# Patient Record
Sex: Female | Born: 1975 | Race: White | Hispanic: No | Smoking: Current every day smoker
Health system: Southern US, Community
[De-identification: ages and names within clinical notes are randomized; demographics above are authoritative.]

## PROBLEM LIST (undated history)

## (undated) DIAGNOSIS — F329 Major depressive disorder, single episode, unspecified: Secondary | ICD-10-CM

## (undated) DIAGNOSIS — F32A Depression, unspecified: Secondary | ICD-10-CM

## (undated) DIAGNOSIS — R569 Unspecified convulsions: Secondary | ICD-10-CM

## (undated) DIAGNOSIS — G40909 Epilepsy, unspecified, not intractable, without status epilepticus: Secondary | ICD-10-CM

## (undated) DIAGNOSIS — F419 Anxiety disorder, unspecified: Secondary | ICD-10-CM

## (undated) DIAGNOSIS — R011 Cardiac murmur, unspecified: Secondary | ICD-10-CM

## (undated) HISTORY — PX: FRACTURE SURGERY: SHX138

## (undated) HISTORY — DX: Unspecified convulsions: R56.9

---

## 1999-04-03 ENCOUNTER — Inpatient Hospital Stay (HOSPITAL_COMMUNITY): Admission: AD | Admit: 1999-04-03 | Discharge: 1999-04-03 | Payer: Self-pay | Admitting: Obstetrics

## 2001-05-12 ENCOUNTER — Inpatient Hospital Stay (HOSPITAL_COMMUNITY): Admission: AD | Admit: 2001-05-12 | Discharge: 2001-05-12 | Payer: Self-pay | Admitting: Obstetrics and Gynecology

## 2001-05-12 ENCOUNTER — Encounter: Payer: Self-pay | Admitting: Obstetrics and Gynecology

## 2001-07-14 ENCOUNTER — Encounter (INDEPENDENT_AMBULATORY_CARE_PROVIDER_SITE_OTHER): Payer: Self-pay | Admitting: Specialist

## 2001-07-14 ENCOUNTER — Inpatient Hospital Stay (HOSPITAL_COMMUNITY): Admission: RE | Admit: 2001-07-14 | Discharge: 2001-07-16 | Payer: Self-pay | Admitting: Obstetrics and Gynecology

## 2002-02-27 ENCOUNTER — Inpatient Hospital Stay (HOSPITAL_COMMUNITY): Admission: AD | Admit: 2002-02-27 | Discharge: 2002-02-27 | Payer: Self-pay | Admitting: Obstetrics and Gynecology

## 2002-03-12 ENCOUNTER — Other Ambulatory Visit: Admission: RE | Admit: 2002-03-12 | Discharge: 2002-03-12 | Payer: Self-pay | Admitting: Obstetrics and Gynecology

## 2002-04-14 ENCOUNTER — Emergency Department (HOSPITAL_COMMUNITY): Admission: EM | Admit: 2002-04-14 | Discharge: 2002-04-15 | Payer: Self-pay | Admitting: Emergency Medicine

## 2002-04-15 ENCOUNTER — Encounter: Payer: Self-pay | Admitting: Emergency Medicine

## 2004-04-29 ENCOUNTER — Emergency Department (HOSPITAL_COMMUNITY): Admission: EM | Admit: 2004-04-29 | Discharge: 2004-04-29 | Payer: Self-pay | Admitting: Emergency Medicine

## 2006-12-30 ENCOUNTER — Emergency Department (HOSPITAL_COMMUNITY): Admission: EM | Admit: 2006-12-30 | Discharge: 2006-12-30 | Payer: Self-pay | Admitting: Emergency Medicine

## 2008-03-05 IMAGING — CR DG CHEST 2V
2 series · 2 of 2 positions shown · non-contrast
Comparison: none

CLINICAL DATA: Chest pain. 
 CHEST ? 2 VIEW:

[w chest pa]
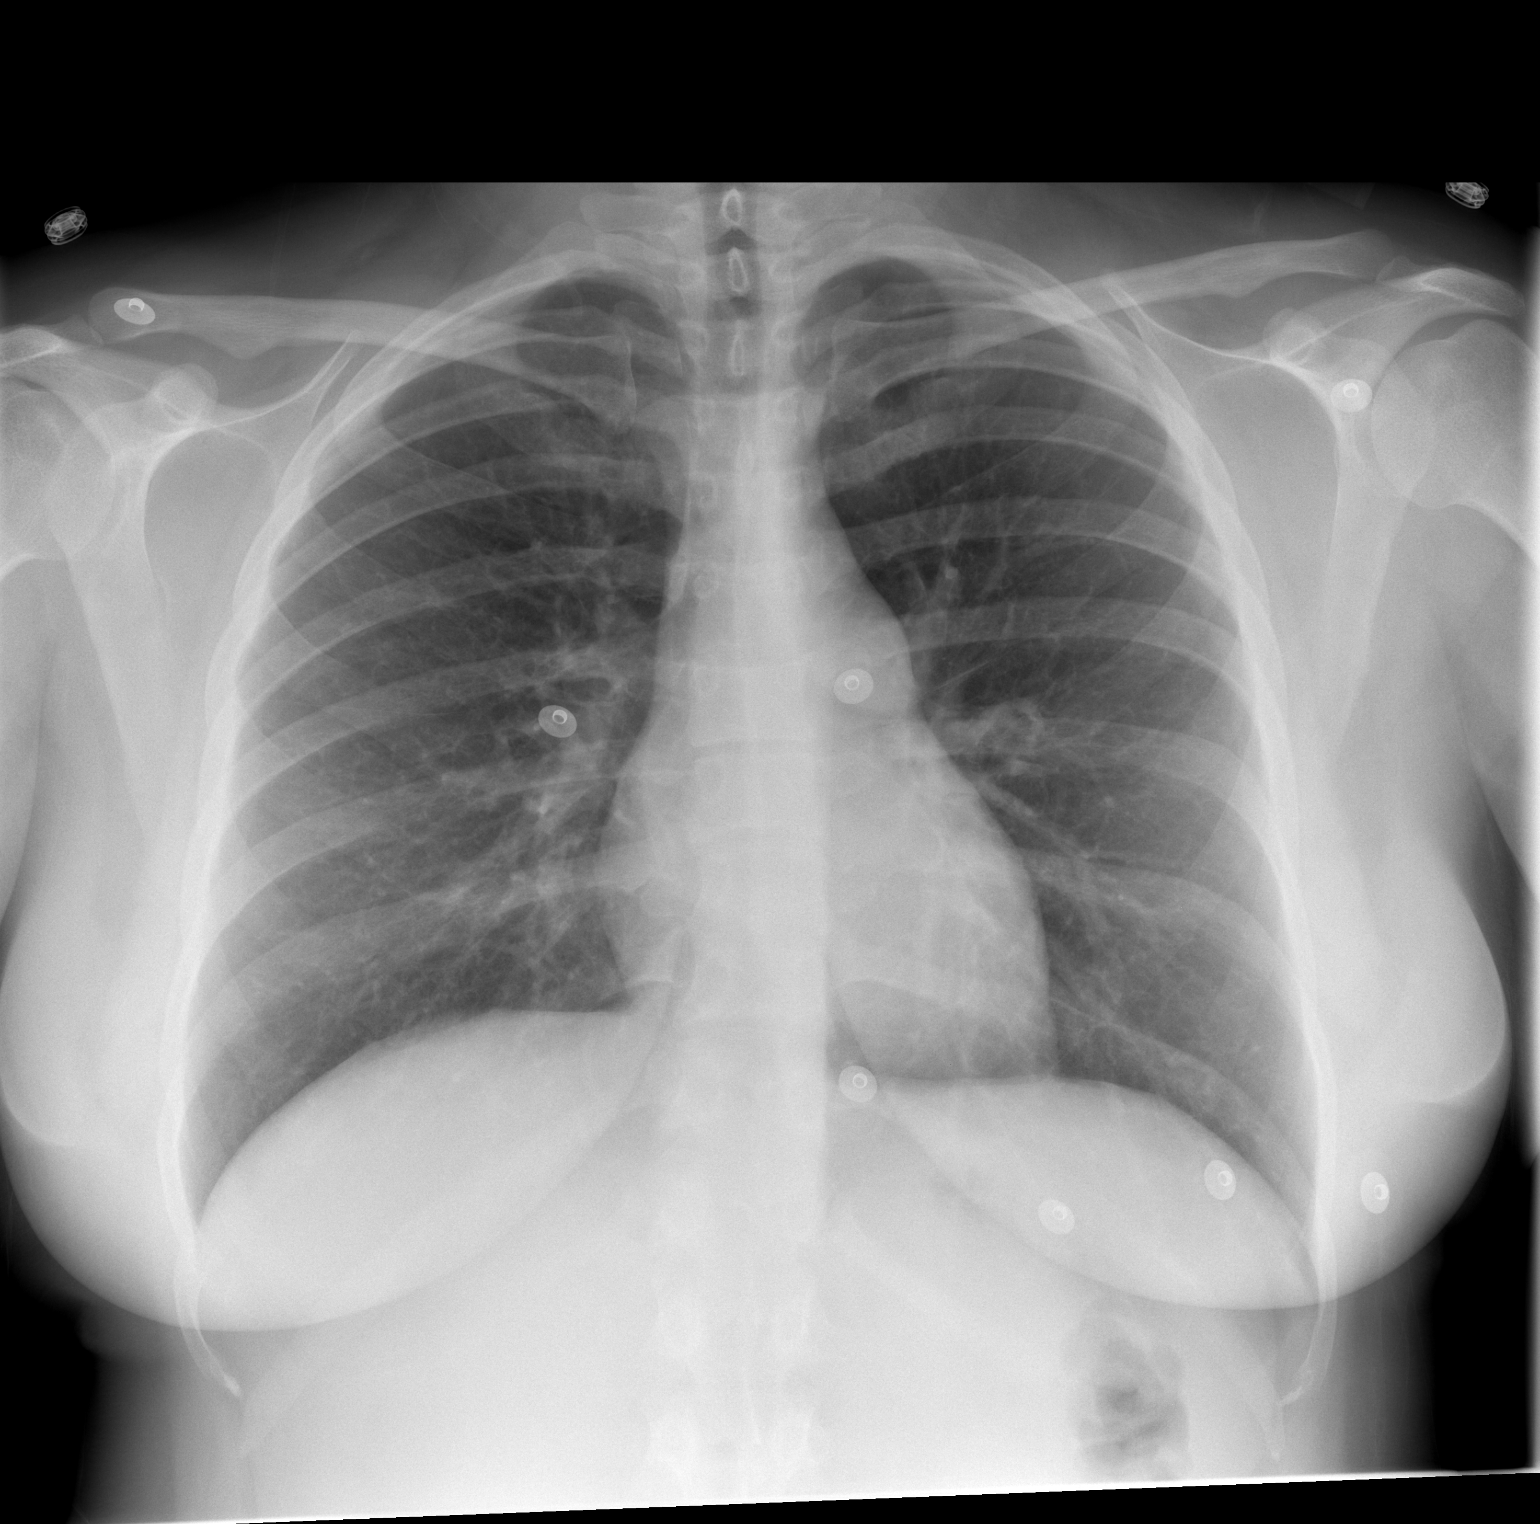

[w chest lat]
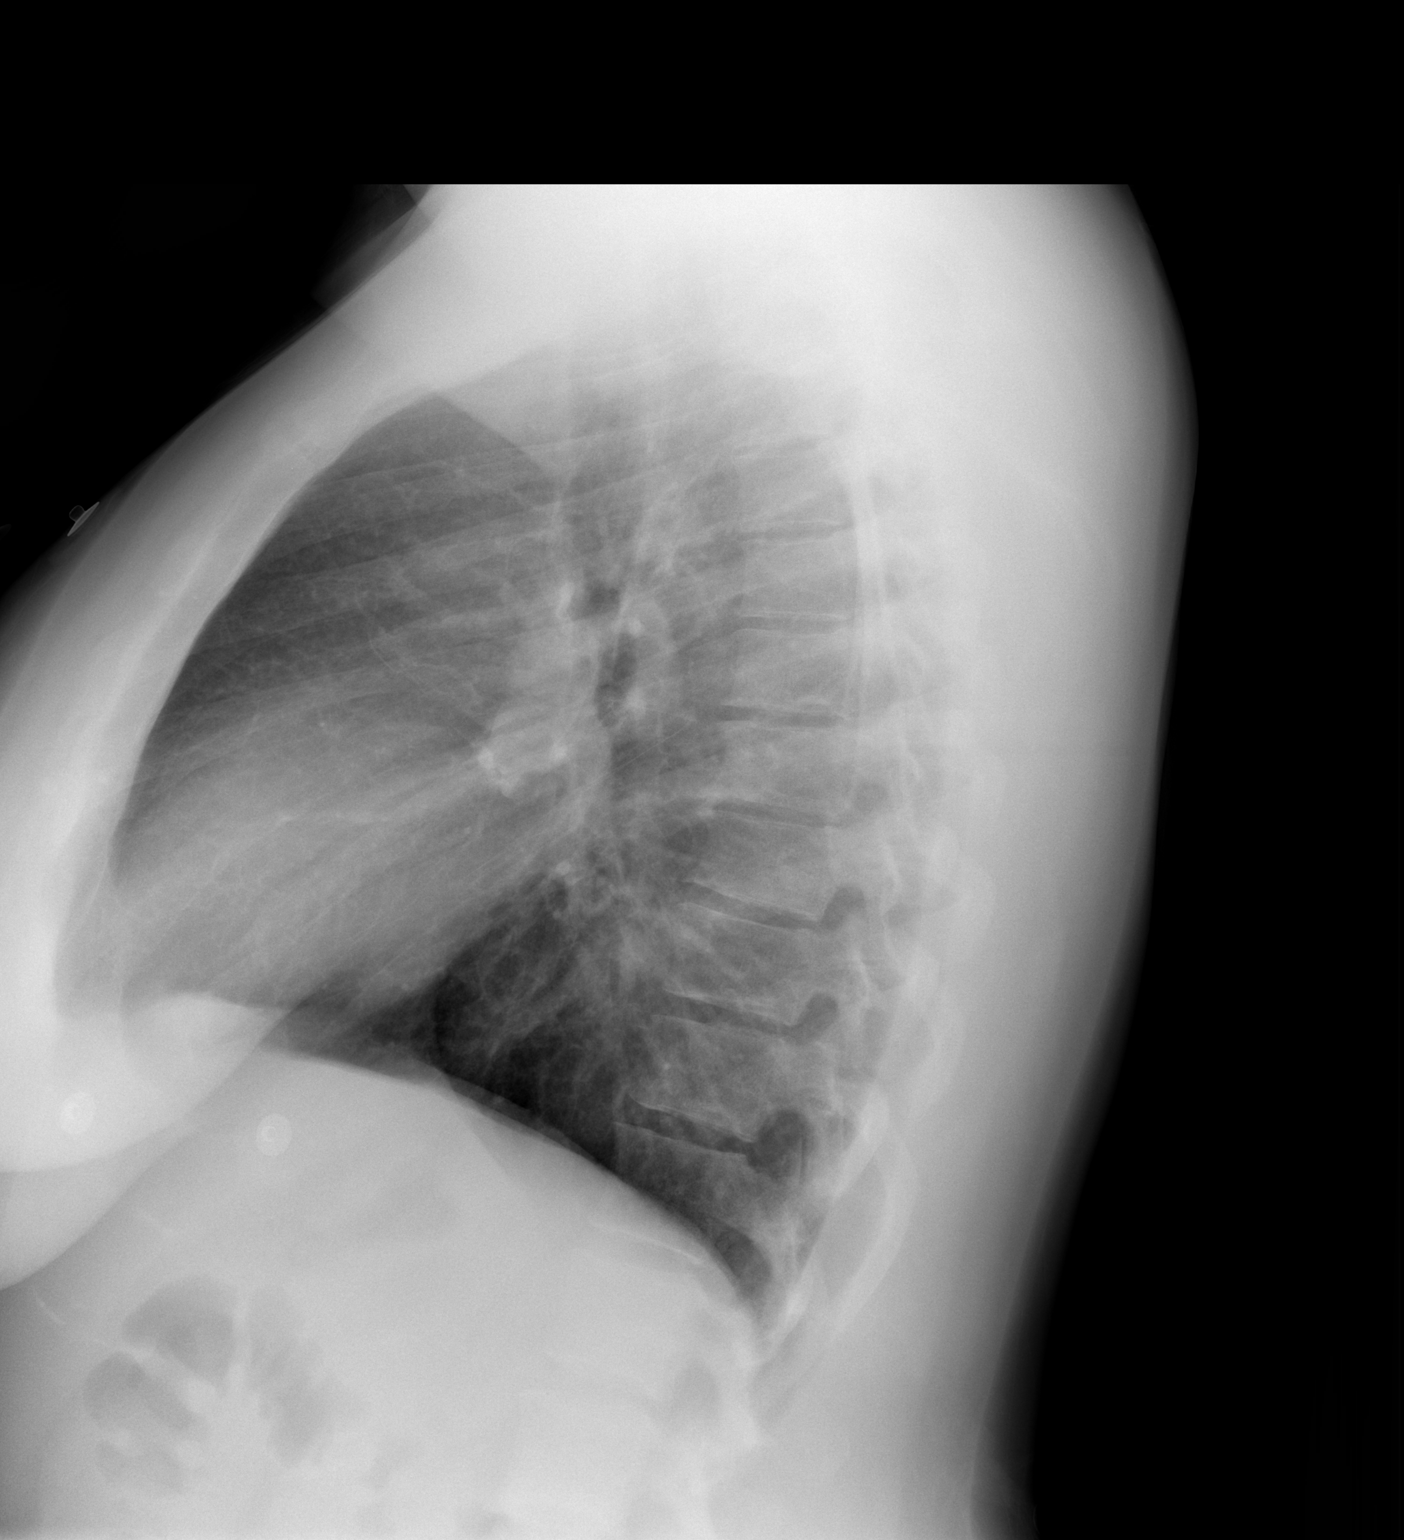

[2 of 2 positions shown; findings below may reference images not displayed]

FINDINGS: The lungs are clear.  Heart size is normal.  No effusion or focal bony abnormality.
IMPRESSION: No acute disease.

## 2008-04-30 ENCOUNTER — Emergency Department (HOSPITAL_COMMUNITY): Admission: EM | Admit: 2008-04-30 | Discharge: 2008-04-30 | Payer: Self-pay | Admitting: Emergency Medicine

## 2008-11-21 ENCOUNTER — Emergency Department (HOSPITAL_COMMUNITY): Admission: EM | Admit: 2008-11-21 | Discharge: 2008-11-21 | Payer: Self-pay | Admitting: Emergency Medicine

## 2010-04-28 ENCOUNTER — Emergency Department (HOSPITAL_COMMUNITY): Admission: EM | Admit: 2010-04-28 | Discharge: 2010-04-28 | Payer: Self-pay | Admitting: Family Medicine

## 2010-04-29 ENCOUNTER — Inpatient Hospital Stay (HOSPITAL_COMMUNITY): Admission: AD | Admit: 2010-04-29 | Discharge: 2010-04-29 | Payer: Self-pay | Admitting: Obstetrics & Gynecology

## 2010-04-29 ENCOUNTER — Ambulatory Visit: Payer: Self-pay | Admitting: Obstetrics and Gynecology

## 2011-01-14 LAB — URINALYSIS, ROUTINE W REFLEX MICROSCOPIC
Glucose, UA: NEGATIVE mg/dL
Ketones, ur: NEGATIVE mg/dL
Protein, ur: NEGATIVE mg/dL

## 2011-01-14 LAB — RAPID URINE DRUG SCREEN, HOSP PERFORMED
Amphetamines: NOT DETECTED
Barbiturates: NOT DETECTED
Benzodiazepines: NOT DETECTED
Opiates: POSITIVE — AB

## 2011-01-14 LAB — CBC
Hemoglobin: 14.7 g/dL (ref 12.0–15.0)
MCH: 32.2 pg (ref 26.0–34.0)
MCHC: 34 g/dL (ref 30.0–36.0)
MCV: 94.6 fL (ref 78.0–100.0)

## 2011-01-14 LAB — POCT URINALYSIS DIP (DEVICE)
Glucose, UA: NEGATIVE mg/dL
Ketones, ur: NEGATIVE mg/dL
Specific Gravity, Urine: 1.02 (ref 1.005–1.030)
Urobilinogen, UA: 0.2 mg/dL (ref 0.0–1.0)

## 2011-01-14 LAB — URINE MICROSCOPIC-ADD ON

## 2011-01-14 LAB — WET PREP, GENITAL: Yeast Wet Prep HPF POC: NONE SEEN
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
## 2011-03-16 NOTE — Discharge Summary (Signed)
[facility]  Patient:    Anita Ball, Anita Ball Visit Number: [id number] MRN: [id number]          Service Type: GYN Location: 9300 9321 01 Attending Physician:  Lenoard Aden Dictated by:   Lenoard Aden, M.D. Admit Date:  07/14/2001 Disc. Date: 07/16/01                             Discharge Summary  HOSPITAL COURSE:              The patient underwent uncomplicated exploratory laparotomy, modified uterine suspension, posterior repair, perineorrhaphy on July 14, 2001. Her postoperative course was uncomplicated. She tolerated a regular diet well on postoperative day #1. Her hemoglobin was 11.8. She was discharged to home on day #2.  DISCHARGE MEDICATIONS:        Tylox, #40, given.  DISCHARGE INSTRUCTIONS:       Discharge teaching done.  DISCHARGE FOLLOWUP:           The patient is to follow up in the office in four weeks. Dictated by:   Lenoard Aden, M.D. Attending Physician:  Lenoard Aden DD:  07/16/01 TD:  07/16/01 Job: [id number]

## 2011-03-16 NOTE — Op Note (Signed)
[facility]  Patient:    Anita Ball, Anita Ball Visit Number: [id number] MRN: [id number]          Service Type: GYN Location: 9300 9321 01 Attending Physician:  Lenoard Aden Proc. Date: 07/14/01 Admit Date:  07/14/2001   CC:         [facility]   Operative Report  PREOPERATIVE DIAGNOSES:       1. Symptomatic pelvic relaxation.                               2. Cystocele and rectocele.                               3. Ovarian cysts.  POSTOPERATIVE DIAGNOSES:      1. Symptomatic pelvic relaxation.                               2. Cystocele and rectocele.                               3. Ovarian cysts.                               4. Endometriosis.  PROCEDURES:                   1. Exploratory laparotomy.                               2. Removal of right peritoneal window.                               3. Ablation of endometriosis.                               4. Modified Gilliam uterine suspension.                               5. Halban culdoplasty.                               6. Rectocele repair.                               7. Perineorrhaphy.  SURGEON:                      Lenoard Aden, M.D.  ASSISTANT:                    Pershing Cox, M.D.  ANESTHESIA:                   General.  ESTIMATED BLOOD LOSS:         Less than 50 cc.  COMPLICATIONS:                None.  DRAINS:  None.  DISPOSITION:                  The patient to recovery in good condition.  DESCRIPTION OF PROCEDURE:     After being apprised of the risks of anesthesia, infection, bleeding, intra-abdominal organs, need for repair, the patient was brought to the operating room where she was prepped and draped in the usual sterile fashion, administered general anesthetic without complications.  Feet are placed in the [facility] stirrups after achieving adequate anesthesia.  Exam under anesthesia reveals a small cystocele, rectocele,  perineal relaxation, grade 2 uterine decensus.  At this time, a Pfannenstiel skin incision made with a scalpel after placement of Foley catheter.  Carried down to the fascia which was nicked in the midline and opened transversely using Mayo scissors. Rectus muscles dissected sharply in the midline, peritoneum entered sharply. Visualization reveals a normal size uterus, normal-appearing tubes, some endometriotic implants on the left and right ovary, and a right peritoneal window.  The right peritoneal window is grasped using Allis clamp and excised along the posterior right uterosacral ligament and excised using sharp dissection.  Defect is closed using 0 Vicryl suture.  Good hemostasis is achieved.  Ablation of endometriosis on the right and left ovary using electrocautery is performed.  At this time, a Halban culdoplasty is used, using a 0 Vicryl stitch after identifying the ureters bilaterally, going from cephalad to caudad, from the reflection of the bowel, down into the cul-de-sac and up to the posterior wall of the cervix.  Four sutures are placed in a similar fashion, and the cul-de-sac is obliterated as the sutures are tied. Ureters are noted not to be dilated and not to be kinked during the process. At this time, the right uterosacral ligament is grasped 3-4 cm from its attachment into the uterus, lateral to the rectus muscles.  This area is dissected off of the anterior wall of the fascia, and the posterior wall of the anterior fascia, and a tonsil clamp is used to make a defect along the internal inguinal ring.  The round ligament is then delivered through this ring after placing an 0 Vicryl stay suture to hold this and is pulled up through the defect and this sutured to the back surface of the anterior fascia using a 2-0 silk suture.  The same procedure done on the left side.  The area is hemostatic.  Good anterior support of the uterus is noted.  Good plication of the cul-de-sac  is noted.  Fascia then closed using the 0 Vicryl in a continuous running fashion, the skin closed using 4-0 Vicryl in a continuous running fashion.  At this time, attention is turned to the vaginal portion of the procedure whereby the cystocele is no longer apparent after re-suspension of the uterus, and a posterior wall defect is noted in the form of a rectocele and perineal relaxation.  The rectal area is infiltrated using dilute Marcaine and Pitressin solution, and it is excised in a wedge-shaped fashion.  This defect is closed using 2-0 Vicryl for deep sutures and then 2-0 Vicryl running stitch.  Perineorrhaphy is performed in the standard fashion using a 2-0 Vicryl.  Good hemostasis is achieved.  The patient is awakened and transferred to recovery in good condition. Attending Physician:  Lenoard Aden DD:  07/14/01 TD:  07/14/01 Job: [id number]

## 2011-07-04 IMAGING — US US TRANSVAGINAL NON-OB
1 series · 14 of 25 positions shown · non-contrast
Comparison: None.

CLINICAL DATA: Vaginal bleeding, pelvic pain



[Series 1: us transvaginal non-ob · 0.21mm/px · 14 of 49 slices shown]
[im 1/49]
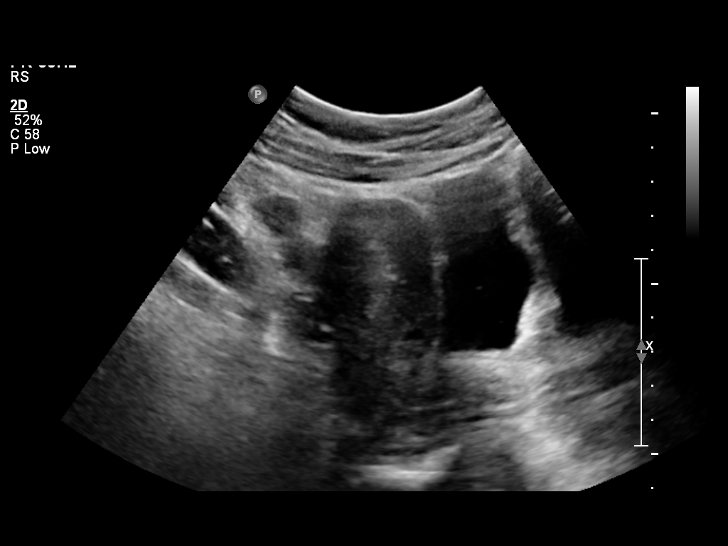
[im 5/49]
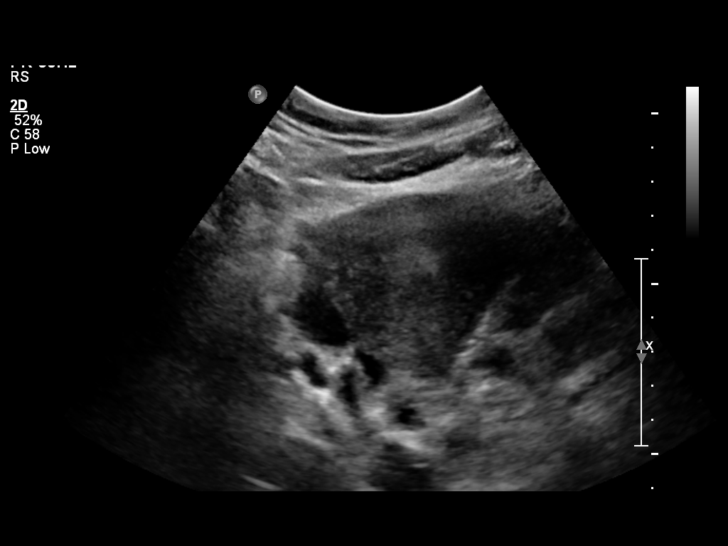
[im 9/49]
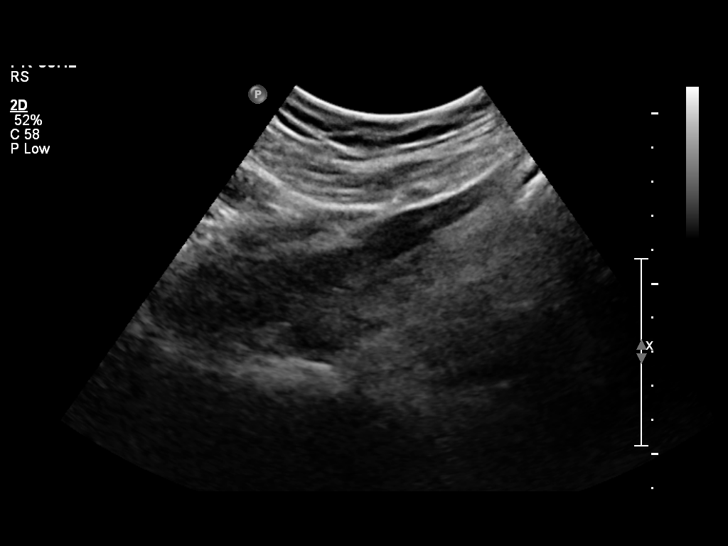
[im 13/49]
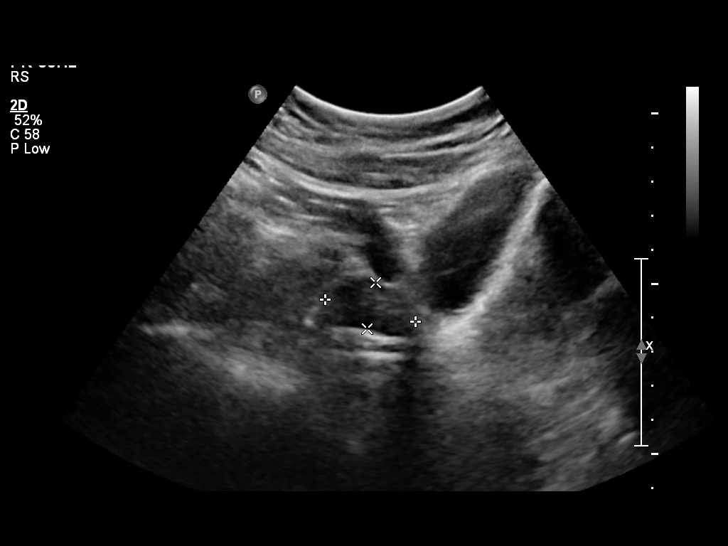
[im 17/49]
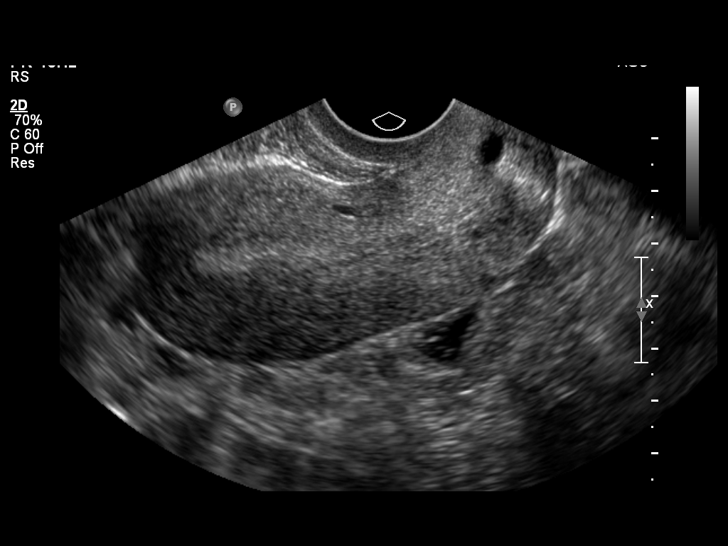
[im 19/49]
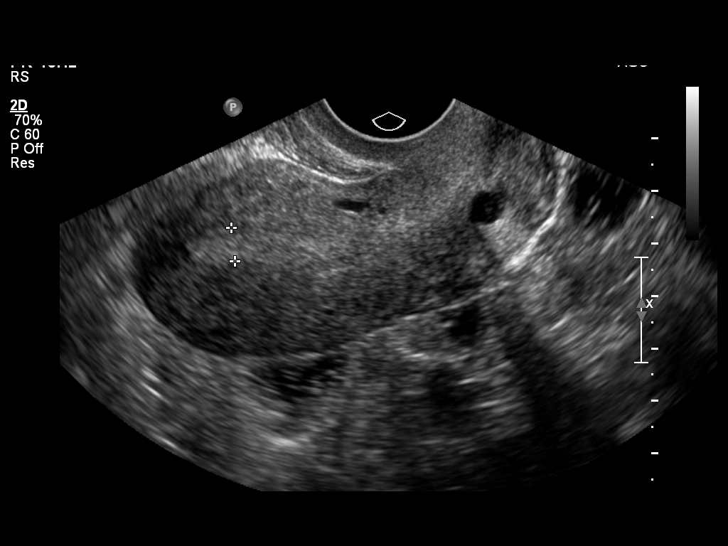
[im 23/49]
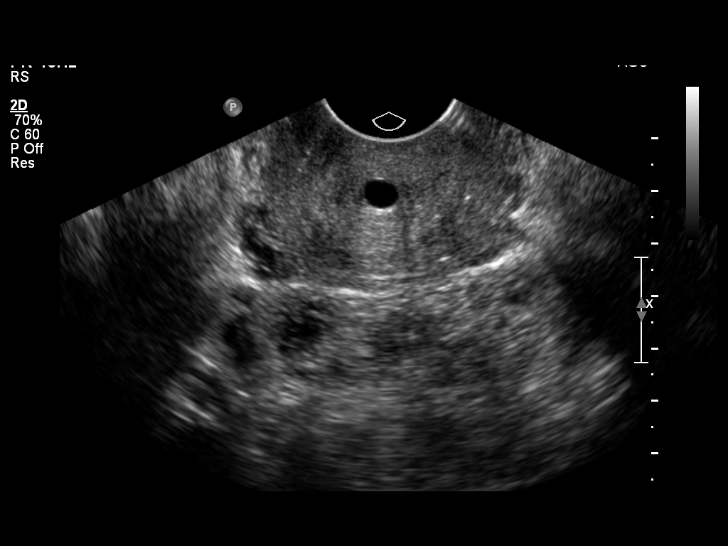
[im 27/49]
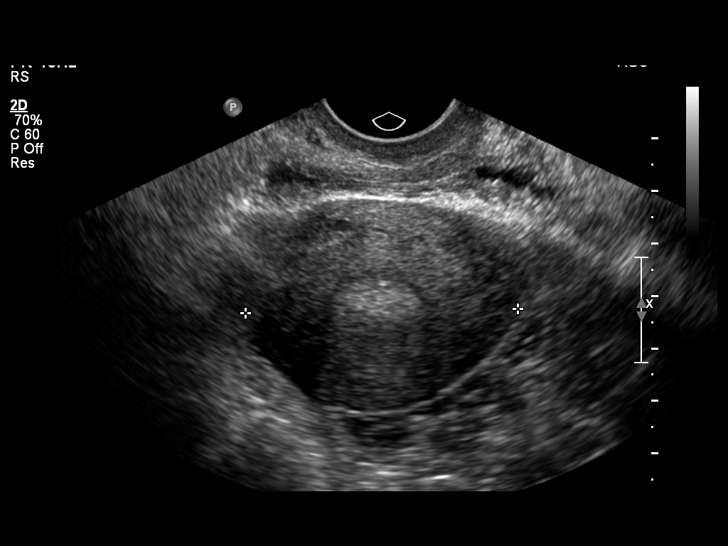
[im 31/49]
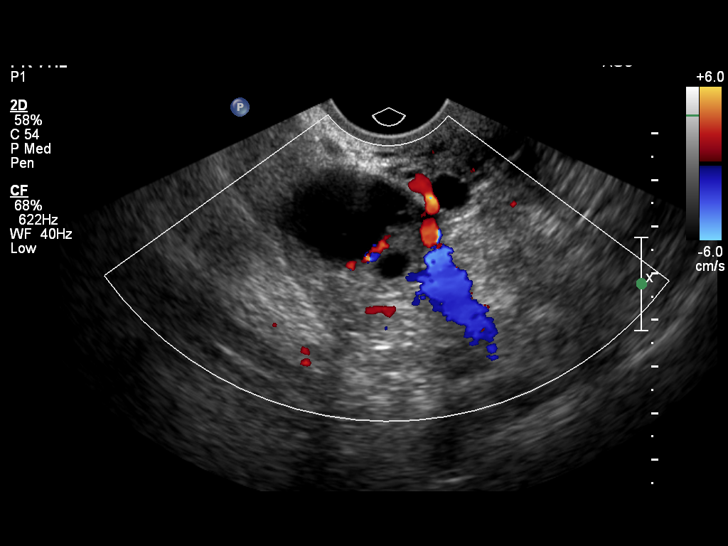
[im 33/49]
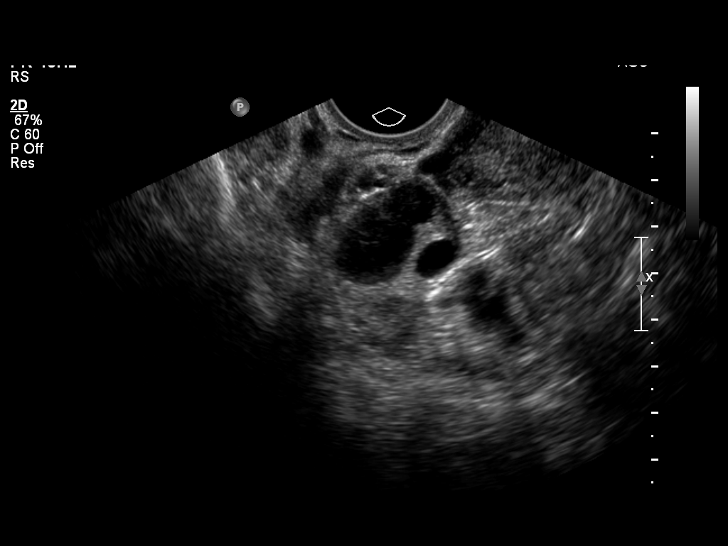
[im 37/49]
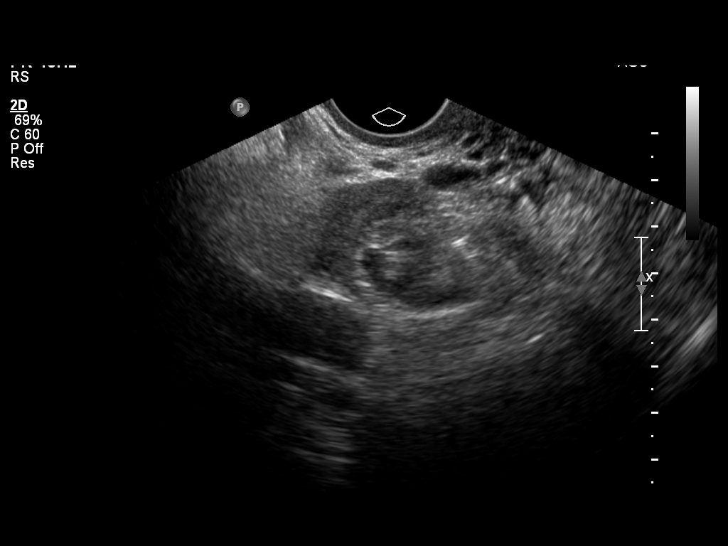
[im 41/49]
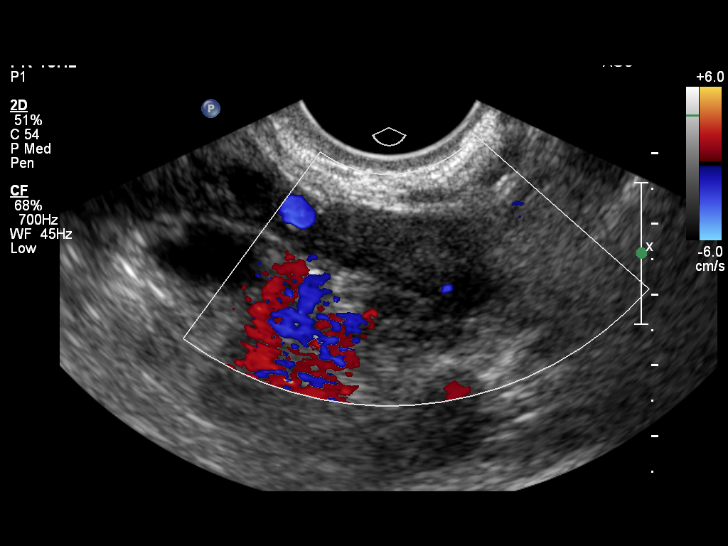
[im 45/49]
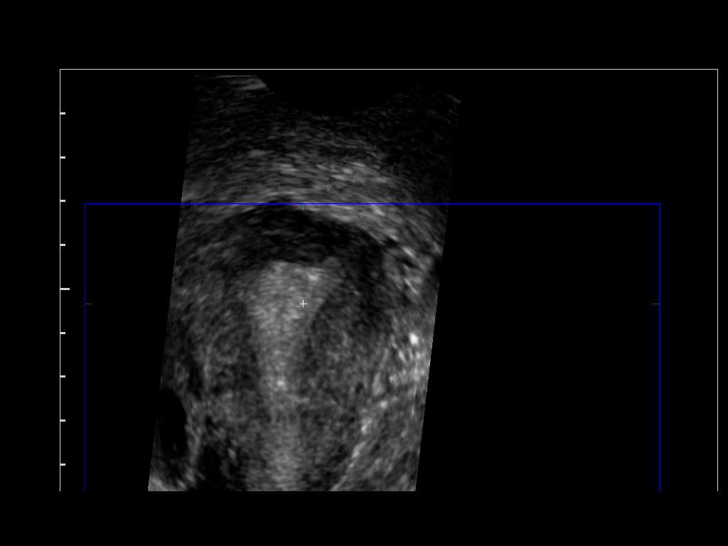
[im 49/49]
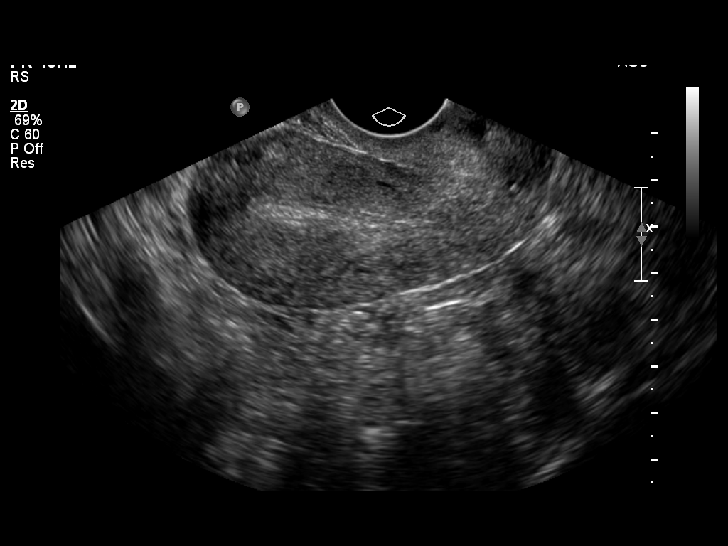

[14 of 25 positions shown; findings below may reference images not displayed]

FINDINGS: Uterus 8.4 x 5.2 x 3.6 cm. Anteverted, anteflexed.  Normal.

Endometrium 6 mm.  Uniformly thin and echogenic.

Right Ovary 4.2 x 3.0 x 2.9 cm.  Normal.

Left Ovary 2.7 x 1.5 x 1.3 cm.  Normal.

Other Findings:  No free fluid.
IMPRESSION: Normal exam.

## 2012-05-30 ENCOUNTER — Ambulatory Visit: Payer: Self-pay | Admitting: Family Medicine

## 2012-05-30 VITALS — BP 119/71 | HR 98 | Temp 97.8°F | Resp 16 | Ht 63.0 in | Wt 186.0 lb

## 2012-05-30 DIAGNOSIS — F419 Anxiety disorder, unspecified: Secondary | ICD-10-CM

## 2012-05-30 DIAGNOSIS — F411 Generalized anxiety disorder: Secondary | ICD-10-CM

## 2012-05-30 MED ORDER — CLONAZEPAM 0.5 MG PO TABS: ORAL_TABLET | ORAL | Status: DC
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
## 2012-05-30 NOTE — Patient Instructions (Addendum)
Start with a 1/2 tablet of your medication twice a day as needed- may increase if necessary.  Please give me a call with an update in about 2 weeks - sooner if worse.

## 2012-06-20 ENCOUNTER — Telehealth: Payer: Self-pay

## 2012-06-20 NOTE — Telephone Encounter (Signed)
DR COPLAND ASKED PATIENT TO CALL HER IN A FEW WEEKS WITH AN UPDATE ON HOW SHE IS HANDLING HER MEDS. SHE CAN ONLY BE CONTACTED ON HER CELL PHONE AFTER 5PM DURING THE WEEK AND ALL DAY ON WEEKEND.   BEST [phone]

## 2012-06-23 NOTE — Telephone Encounter (Signed)
I have called her,left message for her to call me back, I told her I will be here today until 6:30.   Dr Patsy Lager plan: 1.  Anxiety  clonazePAM (KLONOPIN) 0.5 MG tablet    Anita Ball has a somewhat complex psychiatric history. She is uninsured, so access to psychiatric care is difficult for her. I think that she would benefit from an SSRI or similar, but as she had possible suicidal ideation with these medications in the past I would like a psychiatrist to be involved if she elects to try an SSRI again. Discussed with Anita Ball- I am comfortable trying a lower dose of a benzo such as klonopin for her symptoms. However, if this is not working or if she requires higher doses we will need to get a psychiatrist involved. She is ok with this plan, and will call in one or two weeks with an update.

## 2012-06-26 NOTE — Telephone Encounter (Signed)
Pt called back and reports she feels like she is doing pretty well on the klonopin. Sometimes she doesn't need it at all, but most of the time she takes some if she is feeling any anxiety. Pt states most of the time is helps a lot, sometimes just takes the edge off of her anxiety, but overall she feels like it is helping. She reports it is not causing sedation and she has about #4 left of original Rx and has a RF on it. D/W pt that Dr Patsy Lager will want to f/up w/her at the end of that RF since this is a new Rx, or she should RTC sooner if she has any problems w/it. Pt agreed.

## 2012-06-26 NOTE — Telephone Encounter (Signed)
LMOM to CB. 

## 2012-06-27 NOTE — Telephone Encounter (Signed)
Perfect, thanks 

## 2014-01-02 ENCOUNTER — Inpatient Hospital Stay (HOSPITAL_COMMUNITY)
Admission: AD | Admit: 2014-01-02 | Discharge: 2014-01-02 | Disposition: A | Discharge status: Home / Self Care | Payer: BC Managed Care – PPO | Source: Ambulatory Visit | Attending: Obstetrics and Gynecology | Admitting: Obstetrics and Gynecology

## 2014-01-02 ENCOUNTER — Encounter (HOSPITAL_COMMUNITY): Payer: Self-pay | Admitting: *Deleted

## 2014-01-02 DIAGNOSIS — F411 Generalized anxiety disorder: Secondary | ICD-10-CM | POA: Insufficient documentation

## 2014-01-02 DIAGNOSIS — R110 Nausea: Secondary | ICD-10-CM | POA: Insufficient documentation

## 2014-01-02 DIAGNOSIS — F329 Major depressive disorder, single episode, unspecified: Secondary | ICD-10-CM | POA: Insufficient documentation

## 2014-01-02 DIAGNOSIS — F3289 Other specified depressive episodes: Secondary | ICD-10-CM | POA: Insufficient documentation

## 2014-01-02 DIAGNOSIS — N814 Uterovaginal prolapse, unspecified: Secondary | ICD-10-CM | POA: Insufficient documentation

## 2014-01-02 DIAGNOSIS — R300 Dysuria: Secondary | ICD-10-CM | POA: Insufficient documentation

## 2014-01-02 DIAGNOSIS — F17200 Nicotine dependence, unspecified, uncomplicated: Secondary | ICD-10-CM | POA: Insufficient documentation

## 2014-01-02 HISTORY — DX: Depression, unspecified: F32.A

## 2014-01-02 HISTORY — DX: Major depressive disorder, single episode, unspecified: F32.9

## 2014-01-02 HISTORY — DX: Anxiety disorder, unspecified: F41.9

## 2014-01-02 LAB — URINE MICROSCOPIC-ADD ON

## 2014-01-02 LAB — URINALYSIS, ROUTINE W REFLEX MICROSCOPIC
BILIRUBIN URINE: NEGATIVE
Glucose, UA: NEGATIVE mg/dL
KETONES UR: NEGATIVE mg/dL
Leukocytes, UA: NEGATIVE
Nitrite: NEGATIVE
PROTEIN: NEGATIVE mg/dL
Specific Gravity, Urine: 1.01 (ref 1.005–1.030)
UROBILINOGEN UA: 0.2 mg/dL (ref 0.0–1.0)
pH: 5.5 (ref 5.0–8.0)

## 2014-01-02 MED ORDER — PROMETHAZINE HCL 12.5 MG RE SUPP: 12.5000 mg | Freq: Four times a day (QID) | RECTAL | Status: DC | PRN

## 2014-01-02 NOTE — MAU Provider Note (Signed)
History     CSN: [id number]  Arrival date and time: 01/02/14 1723 Provider in-house: 1723 Pt placed in MAU room: 1925 Provider at bedside: 1936    Chief Complaint  Patient presents with  . Vaginal Prolapse   HPI  [name] 38 yo non pregnant female presenting with worsening uterine prolapse, pain that is now subsiding, and intermittent nausea.  She also reports having small amounts of urine when urinating. She was  just seen in the office Thursday 3/5 for this same complaint.  Her primary care provider at [facility] is Dr. Billy Coast.  Past Medical History  Diagnosis Date  . Anxiety   . Depression     History reviewed. No pertinent past surgical history.   History reviewed. No pertinent family history.   History  Substance Use Topics  . Smoking status: Current Every Day Smoker  . Smokeless tobacco: Not on file  . Alcohol Use: Not on file    Allergies: No Known Allergies  Prescriptions prior to admission  Medication Sig Dispense Refill  . clonazePAM (KLONOPIN) 0.5 MG tablet Take 1/2 or 1 up to three times daily as needed for anxiety  40 tablet  1  . ibuprofen (ADVIL,MOTRIN) 200 MG tablet Take 200 mg by mouth daily.        Review of Systems  Constitutional: Negative.   HENT: Negative.   Eyes: Negative.   Respiratory: Negative.   Cardiovascular: Negative.   Gastrointestinal: Positive for nausea.  Genitourinary: Positive for dysuria.       Mild dysuria and small amounts of urine with each BR trip  Musculoskeletal: Negative.   Skin: Negative.   Neurological: Negative.   Endo/Heme/Allergies: Negative.   Psychiatric/Behavioral: Negative.    Results for orders placed during the hospital encounter of 01/02/14 (from the past 24 hour(s))  URINALYSIS, ROUTINE W REFLEX MICROSCOPIC     Status: Abnormal   Collection Time    01/02/14  6:05 PM      Result Value Ref Range   Color, Urine YELLOW  YELLOW   APPearance CLEAR  CLEAR   Specific Gravity, Urine 1.010  1.005  - 1.030   pH 5.5  5.0 - 8.0   Glucose, UA NEGATIVE  NEGATIVE mg/dL   Hgb urine dipstick TRACE (*) NEGATIVE   Bilirubin Urine NEGATIVE  NEGATIVE   Ketones, ur NEGATIVE  NEGATIVE mg/dL   Protein, ur NEGATIVE  NEGATIVE mg/dL   Urobilinogen, UA 0.2  0.0 - 1.0 mg/dL   Nitrite NEGATIVE  NEGATIVE   Leukocytes, UA NEGATIVE  NEGATIVE  URINE MICROSCOPIC-ADD ON     Status: None   Collection Time    01/02/14  6:05 PM      Result Value Ref Range   Squamous Epithelial / LPF RARE  RARE   WBC, UA 0-2  <3 WBC/hpf   Bacteria, UA RARE  RARE    Physical Exam   Blood pressure 111/63, pulse 65, temperature 98.5 F (36.9 C), height 5\' 3"  (1.6 m), weight 80.015 kg (176 lb 6.4 oz), last menstrual period 12/12/2013.  Physical Exam  Constitutional: She is oriented to person, place, and time. She appears well-developed and well-nourished.  HENT:  Head: Normocephalic and atraumatic.  Eyes: Pupils are equal, round, and reactive to light.  Neck: Normal range of motion. Neck supple.  Cardiovascular: Normal rate, regular rhythm and normal heart sounds.   Respiratory: Effort normal and breath sounds normal.  GI: Soft. Bowel sounds are normal.  Genitourinary:  Anterior vaginal wall protruding  out of introitus ~ 1.5cm, cervix felt just inside introitus; able to reduce prolapse manually without difficulty. Bladder scanner reveals 250 mL of urine in bladder immediately after pt left CC urine specimen  Musculoskeletal: Normal range of motion.  Neurological: She is alert and oriented to person, place, and time.  Skin: Skin is warm and dry.  Psychiatric: She has a normal mood and affect. Her behavior is normal. Judgment and thought content normal.    MAU Course  Procedures CCUA Bladder scanner Assessment and Plan  38 yo Non-Pregnant female Uterine Prolapse Dysuria Nausea  Discharge Patient after voiding Rx for Phenergan 12.5 mg suppository p.r. every 4-6 hrs prn nausea Colace OTC Tylenol for pain F/U  prn Dr. Billy Coastaavon to refer to [place] specialist for further evaluation   *Dr. Billy Coastaavon notified of assessment and plan - agrees  Kenard GowerAWSON, Kelseigh Diver, M, MSN, CNM 01/02/2014, 7:37 PM

## 2014-01-02 NOTE — Discharge Instructions (Signed)

## 2014-01-02 NOTE — MAU Note (Signed)
Saw MD Thurs and have previous uterine prolapse and has fallen again. Today at work I felt it fall. Now feels like pressure. Have been having abd pain and nausea and hot flashes. Pain has subsided now somewhat. Have occ shooting, burning pain. Today hard to urinate

## 2014-05-26 ENCOUNTER — Emergency Department (HOSPITAL_COMMUNITY): Payer: BC Managed Care – PPO

## 2014-05-26 ENCOUNTER — Emergency Department (HOSPITAL_COMMUNITY)
Admission: EM | Admit: 2014-05-26 | Discharge: 2014-05-26 | Disposition: A | Discharge status: Home / Self Care | Payer: BC Managed Care – PPO | Attending: Emergency Medicine | Admitting: Emergency Medicine

## 2014-05-26 ENCOUNTER — Encounter (HOSPITAL_COMMUNITY): Payer: Self-pay | Admitting: Emergency Medicine

## 2014-05-26 ENCOUNTER — Ambulatory Visit (INDEPENDENT_AMBULATORY_CARE_PROVIDER_SITE_OTHER): Payer: BC Managed Care – PPO | Admitting: Family Medicine

## 2014-05-26 VITALS — BP 140/90 | HR 69 | Temp 98.5°F | Resp 16 | Ht 63.25 in | Wt 173.0 lb

## 2014-05-26 DIAGNOSIS — R079 Chest pain, unspecified: Secondary | ICD-10-CM

## 2014-05-26 DIAGNOSIS — F3289 Other specified depressive episodes: Secondary | ICD-10-CM | POA: Insufficient documentation

## 2014-05-26 DIAGNOSIS — F17200 Nicotine dependence, unspecified, uncomplicated: Secondary | ICD-10-CM | POA: Insufficient documentation

## 2014-05-26 DIAGNOSIS — F411 Generalized anxiety disorder: Secondary | ICD-10-CM | POA: Insufficient documentation

## 2014-05-26 DIAGNOSIS — R0789 Other chest pain: Secondary | ICD-10-CM

## 2014-05-26 DIAGNOSIS — F329 Major depressive disorder, single episode, unspecified: Secondary | ICD-10-CM | POA: Insufficient documentation

## 2014-05-26 DIAGNOSIS — R42 Dizziness and giddiness: Secondary | ICD-10-CM | POA: Insufficient documentation

## 2014-05-26 DIAGNOSIS — H8110 Benign paroxysmal vertigo, unspecified ear: Secondary | ICD-10-CM

## 2014-05-26 DIAGNOSIS — Z791 Long term (current) use of non-steroidal anti-inflammatories (NSAID): Secondary | ICD-10-CM | POA: Insufficient documentation

## 2014-05-26 DIAGNOSIS — Z3202 Encounter for pregnancy test, result negative: Secondary | ICD-10-CM | POA: Insufficient documentation

## 2014-05-26 LAB — URINALYSIS, ROUTINE W REFLEX MICROSCOPIC
Bilirubin Urine: NEGATIVE
Glucose, UA: NEGATIVE mg/dL
Hgb urine dipstick: NEGATIVE
Ketones, ur: NEGATIVE mg/dL
LEUKOCYTES UA: NEGATIVE
NITRITE: NEGATIVE
PROTEIN: NEGATIVE mg/dL
Specific Gravity, Urine: 1.009 (ref 1.005–1.030)
Urobilinogen, UA: 0.2 mg/dL (ref 0.0–1.0)
pH: 7 (ref 5.0–8.0)

## 2014-05-26 LAB — BASIC METABOLIC PANEL
Anion gap: 17 — ABNORMAL HIGH (ref 5–15)
BUN: 6 mg/dL (ref 6–23)
CALCIUM: 9 mg/dL (ref 8.4–10.5)
CO2: 21 meq/L (ref 19–32)
CREATININE: 0.77 mg/dL (ref 0.50–1.10)
Chloride: 102 mEq/L (ref 96–112)
GFR calc Af Amer: >90 mL/min (ref >90)
GFR calc non Af Amer: >90 mL/min (ref >90)
GLUCOSE: 84 mg/dL (ref 70–99)
Potassium: 3.9 mEq/L (ref 3.7–5.3)
SODIUM: 140 meq/L (ref 137–147)

## 2014-05-26 LAB — CBC
HEMATOCRIT: 41.6 % (ref 36.0–46.0)
HEMOGLOBIN: 14.5 g/dL (ref 12.0–15.0)
MCH: 31.4 pg (ref 26.0–34.0)
MCHC: 34.9 g/dL (ref 30.0–36.0)
MCV: 90 fL (ref 78.0–100.0)
Platelets: 254 10*3/uL (ref 150–400)
RBC: 4.62 MIL/uL (ref 3.87–5.11)
RDW: 13.3 % (ref 11.5–15.5)
WBC: 11 10*3/uL — ABNORMAL HIGH (ref 4.0–10.5)

## 2014-05-26 LAB — TROPONIN I

## 2014-05-26 LAB — PREGNANCY, URINE: Preg Test, Ur: NEGATIVE

## 2014-05-26 MED ORDER — KETOROLAC TROMETHAMINE 30 MG/ML IJ SOLN
30.0000 mg | Freq: Once | INTRAMUSCULAR | Status: AC
  Administered 2014-05-26: 30 mg via INTRAVENOUS
  Filled 2014-05-26: qty 1

## 2014-05-26 MED ORDER — SODIUM CHLORIDE 0.9 % IV BOLUS (SEPSIS)
1000.0000 mL | Freq: Once | INTRAVENOUS | Status: AC
  Administered 2014-05-26: 1000 mL via INTRAVENOUS

## 2014-05-26 MED ORDER — LORAZEPAM 2 MG/ML IJ SOLN
1.0000 mg | Freq: Once | INTRAMUSCULAR | Status: AC
  Administered 2014-05-26: 1 mg via INTRAVENOUS
  Filled 2014-05-26: qty 1

## 2014-05-26 MED ORDER — GI COCKTAIL ~~LOC~~: 30.0000 mL | Freq: Once | ORAL | Status: DC

## 2014-05-26 MED ORDER — TRAMADOL HCL 50 MG PO TABS: 50.0000 mg | ORAL_TABLET | Freq: Four times a day (QID) | ORAL | Status: DC | PRN

## 2014-05-26 NOTE — Discharge Instructions (Signed)
Your work up at the emergency department was negative for any acute cardiac or pulmonary causes of your chest pain at this time. Please follow up with your primary care physician in 1-2 days. If you do not have one please call the [facility] number listed above. Please take pain medication and/or muscle relaxants as prescribed and as needed for pain. Please do not drive on narcotic pain medication or on muscle relaxants. Please read all discharge instructions and return precautions.    Chest Pain Observation It is often hard to give a specific diagnosis for the cause of chest pain. Among other possibilities your symptoms might be caused by inadequate oxygen delivery to your heart (angina). Angina that is not treated or evaluated can lead to a heart attack (myocardial infarction) or death. Blood tests, electrocardiograms, and X-rays may have been done to help determine a possible cause of your chest pain. After evaluation and observation, your health care provider has determined that it is unlikely your pain was caused by an unstable condition that requires hospitalization. However, a full evaluation of your pain may need to be completed, with additional diagnostic testing as directed. It is very important to keep your follow-up appointments. Not keeping your follow-up appointments could result in permanent heart damage, disability, or death. If there is any problem keeping your follow-up appointments, you must call your health care provider. HOME CARE INSTRUCTIONS  Due to the slight chance that your pain could be angina, it is important to follow your health care provider's treatment plan and also maintain a healthy lifestyle:  Maintain or work toward achieving a healthy weight.  Stay physically active and exercise regularly.  Decrease your salt intake.  Eat a balanced, healthy diet. Talk to a dietitian to learn about heart-healthy foods.  Increase your fiber intake by including  whole grains, vegetables, fruits, and nuts in your diet.  Avoid situations that cause stress, anger, or depression.  Take medicines as advised by your health care provider. Report any side effects to your health care provider. Do not stop medicines or adjust the dosages on your own.  Quit smoking. Do not use nicotine patches or gum until you check with your health care provider.  Keep your blood pressure, blood sugar, and cholesterol levels within normal limits.  Limit alcohol intake to no more than 1 drink per day for women who are not pregnant and 2 drinks per day for men.  Do not abuse drugs. SEEK IMMEDIATE MEDICAL CARE IF: You have severe chest pain or pressure which may include symptoms such as:  You feel pain or pressure in your arms, neck, jaw, or back.  You have severe back or abdominal pain, feel sick to your stomach (nauseous), or throw up (vomit).  You are sweating profusely.  You are having a fast or irregular heartbeat.  You feel short of breath while at rest.  You notice increasing shortness of breath during rest, sleep, or with activity.  You have chest pain that does not get better after rest or after taking your usual medicine.  You wake from sleep with chest pain.  You are unable to sleep because you cannot breathe.  You develop a frequent cough or you are coughing up blood.  You feel dizzy, faint, or experience extreme fatigue.  You develop severe weakness, dizziness, fainting, or chills. Any of these symptoms may represent a serious problem that is an emergency. Do not wait to see if the symptoms will go away.  Call your local emergency services (911 in the [place] Do not drive yourself to the hospital. MAKE SURE YOU:  Understand these instructions.  Will watch your condition.  Will get help right away if you are not doing well or get worse. Document Released: 11/17/2010 Document Revised: 10/20/2013 Document Reviewed: 04/16/2013 [facility] Patient  Information 2015 [place], [place]. This information is not intended to replace advice given to you by your health care provider. Make sure you discuss any questions you have with your health care provider.

## 2014-05-26 NOTE — Progress Notes (Signed)
[facility] [address], [place] [id number] [phone]  Date:  05/26/2014   Name:  Anita HarmanKatherine L Ball   DOB:  02/24/1976   MRN:  [id number]  PCP:  No PCP Per Patient    Chief Complaint: Dizziness and Chest Pain   History of Present Illness:  Anita HarmanKatherine L Ball is a 38 y.o. very pleasant female patient who presents with the following:  Here today with chest pains that started last night.  She notes the pain under her sternum and sometimes into her left chest and shoulder, "pec area."  She first noted "a squeeze," kind of like a cramp.  She still notes it now, off and on.  The intensity will wax and wane. May last for a minute or so.   She has had some chest pain in the past, but has not been seen for it in the past at least recently.  She did have an EKG once when she was living in [place] but was told she was ok.   She was at rest- about to have dinner- last night when the sx began.  No correlation with exercise.    She also notes dizziness which has occurred off and on for a year or two.   It occurs when she moves quickly.  She occasionally has "blurred vision or will lose my balance."  She notes the dizziness- vertigo- with movement.  Ok when still.   She has never had a stress test and is not aware of any family history of heart trouble.  However she later mentioned that she had ?a cath a few years ago (unsure when) for chest pain that turned out to be "stress."     She is a smoker  There are no active problems to display for this patient.   Past Medical History  Diagnosis Date  . Anxiety   . Depression     History reviewed. No pertinent past surgical history.  History  Substance Use Topics  . Smoking status: Current Every Day Smoker  . Smokeless tobacco: Not on file  . Alcohol Use: Not on file    History reviewed. No pertinent family history.  No Known Allergies  Medication list has been reviewed and updated.  Current Outpatient  Prescriptions on File Prior to Visit  Medication Sig Dispense Refill  . clonazePAM (KLONOPIN) 0.5 MG tablet Take 1/2 or 1 up to three times daily as needed for anxiety  40 tablet  1  . ibuprofen (ADVIL,MOTRIN) 200 MG tablet Take 200 mg by mouth daily.      . promethazine (PHENERGAN) 12.5 MG suppository Place 1 suppository (12.5 mg total) rectally every 6 (six) hours as needed for nausea or vomiting.  12 each  0   No current facility-administered medications on file prior to visit.    Review of Systems:  As per HPI- otherwise negative.   Physical Examination: Filed Vitals:   05/26/14 1244  BP: 110/68  Pulse: 69  Temp: 98.5 F (36.9 C)  Resp: 16   Filed Vitals:   05/26/14 1244  Height: 5' 3.25" (1.607 m)  Weight: 173 lb (78.472 kg)   Body mass index is 30.39 kg/(m^2). Ideal Body Weight: Weight in (lb) to have BMI = 25: 142  GEN: WDWN, NAD, Non-toxic, A & O x 3, overweight, looks well HEENT: Atraumatic, Normocephalic. Neck supple. No masses, No LAD. Ears and Nose: No external deformity. CV: RRR, No M/G/R. No JVD. No thrill. No extra  heart sounds. PULM: CTA B, no wheezes, crackles, rhonchi. No retractions. No resp. distress. No accessory muscle use. ABD: S, NT, ND, +BS. No rebound. No HSM. EXTR: No c/c/e NEURO Normal gait. normal strength, sensation and DTR all extremities  PSYCH: Normally interactive. Conversant. Not depressed or anxious appearing.  Calm demeanor.  Not able to reproduce CP by pressing on her chest wall.  No rash or lesion  EKG: SR without ST elevation or depression.   Gi cocktail did not relieve her pain.  Recommended that we transport her to the ED via EMS for persistent active chest pain.  She declines ambulance transfer, but is willing to have a friend take her to the ED.   Given asa 81 mg po #4 at 2:18 pm.     Assessment and Plan: Chest pain, unspecified chest pain type - Plan: gi cocktail (Maalox,Lidocaine,Donnatal), EKG 12-Lead  Benign paroxysmal  positional vertigo, unspecified laterality  Suspect BPPV.  Persistent chest pain.  She is an unlikely pt for CAD, but needs further evaluation for persistent sx.  See above- she declined EMS transport but plans to have a friend drive her to the hospital.  Left clinic.   Signed Abbe Amsterdam, MD

## 2014-05-26 NOTE — Patient Instructions (Signed)
Please proceed directly to the hospital [facility]([place] [facility]) for further evaluation of your chest pain.   I think your vertigo is due to a benign inner ear condition.  However if this does not resolve please come back for a recheck

## 2014-05-26 NOTE — ED Provider Notes (Signed)
Medical screening examination/treatment/procedure(s) were performed by non-physician practitioner and as supervising physician I was immediately available for consultation/collaboration.   EKG Interpretation   Date/Time:  Wednesday May 26 2014 14:55:36 EDT Ventricular Rate:  62 PR Interval:  124 QRS Duration: 86 QT Interval:  394 QTC Calculation: 399 R Axis:   57 Text Interpretation:  Normal sinus rhythm Normal ECG Confirmed by Malva CoganELOS   MD, Loys Shugars ([id number]) on 05/26/2014 5:16:36 PM       Geoffery Lyonsouglas Yvetta Drotar, MD 05/26/14 1954

## 2014-05-26 NOTE — ED Notes (Signed)
Pt still complains of pains in middle of chest.

## 2014-05-26 NOTE — ED Notes (Signed)
The pt has had mid-chest pain since last pm with dizziness .  She also has anxiety attacks. [facility] for [facility].  [facility].  lmp 2 weeks ago

## 2014-05-26 NOTE — ED Notes (Signed)
PT ambulated with baseline gait; VSS; A&Ox3; no signs of distress; respirations even and unlabored; skin warm and dry; no questions upon discharge.  

## 2014-05-26 NOTE — ED Provider Notes (Signed)
CSN: [id number]     Arrival date & time 05/26/14  1452 History   First MD Initiated Contact with Patient 05/26/14 1703     Chief Complaint  Patient presents with  . Chest Pain     (Consider location/radiation/quality/duration/timing/severity/associated sxs/prior Treatment) HPI Comments: Patient is a 38 year old female past medical history significant for anxiety, depression, tobacco abuse presenting to the emergency department for intermittent episodes of mid chest pain with radiation to left arm since last evening at 8 PM. Patient also endorses intermittent episodes of lightheadedness. Denies any alleviating or factors. No medications taken at home. She is sent over from urgent care Center for further evaluation. No history of cardiac problems. No early familial cardiac history. PERC negative.   Patient is a 38 y.o. female presenting with chest pain.  Chest Pain Associated symptoms: no fever     Past Medical History  Diagnosis Date  . Anxiety   . Depression    History reviewed. No pertinent past surgical history. No family history on file. History  Substance Use Topics  . Smoking status: Current Every Day Smoker  . Smokeless tobacco: Not on file  . Alcohol Use: No   OB History   Grav Para Term Preterm Abortions TAB SAB Ect Mult Living                 Review of Systems  Constitutional: Negative for fever and chills.  Cardiovascular: Positive for chest pain.  Neurological: Positive for light-headedness.  All other systems reviewed and are negative.     Allergies  Review of patient's allergies indicates no known allergies.  Home Medications   Prior to Admission medications   Medication Sig Start Date End Date Taking? Authorizing Provider  ibuprofen (ADVIL,MOTRIN) 200 MG tablet Take 200 mg by mouth daily.   Yes Historical Provider, MD  traMADol (ULTRAM) 50 MG tablet Take 1 tablet (50 mg total) by mouth every 6 (six) hours as needed. 05/26/14   Lauryl Seyer L Marchel Foote,  PA-C   BP 125/58  Pulse 68  Temp(Src) 98.2 F (36.8 C) (Oral)  Resp 18  Ht 5\' 2"  (1.575 m)  Wt 175 lb (79.379 kg)  BMI 32.00 kg/m2  SpO2 99%  LMP 05/12/2014 Physical Exam  Nursing note and vitals reviewed. Constitutional: She is oriented to person, place, and time. She appears well-developed and well-nourished. No distress.  HENT:  Head: Normocephalic and atraumatic.  Right Ear: External ear normal.  Left Ear: External ear normal.  Nose: Nose normal.  Mouth/Throat: Oropharynx is clear and moist. No oropharyngeal exudate.  Eyes: Conjunctivae and EOM are normal. Pupils are equal, round, and reactive to light.  Neck: Normal range of motion. Neck supple.  Cardiovascular: Normal rate, regular rhythm, normal heart sounds and intact distal pulses.   Pulmonary/Chest: Effort normal and breath sounds normal. No respiratory distress. She exhibits no tenderness.  Abdominal: Soft. Bowel sounds are normal. There is no tenderness.  Musculoskeletal: She exhibits no edema.  Neurological: She is alert and oriented to person, place, and time. She has normal strength. No cranial nerve deficit. Gait normal. GCS eye subscore is 4. GCS verbal subscore is 5. GCS motor subscore is 6.  Sensation grossly intact.  No pronator drift.  Bilateral heel-knee-shin intact.  Skin: Skin is warm and dry. No rash noted. She is not diaphoretic.    ED Course  Procedures (including critical care time) Medications  sodium chloride 0.9 % bolus 1,000 mL (0 mLs Intravenous Stopped 05/26/14 1847)  LORazepam (ATIVAN) injection  1 mg (1 mg Intravenous Given 05/26/14 1807)  ketorolac (TORADOL) 30 MG/ML injection 30 mg (30 mg Intravenous Given 05/26/14 1849)    Labs Review Labs Reviewed  CBC - Abnormal; Notable for the following:    WBC 11.0 (*)    All other components within normal limits  BASIC METABOLIC PANEL - Abnormal; Notable for the following:    Anion gap 17 (*)    All other components within normal limits   URINALYSIS, ROUTINE W REFLEX MICROSCOPIC - Abnormal; Notable for the following:    APPearance CLOUDY (*)    All other components within normal limits  TROPONIN I  PREGNANCY, URINE    Imaging Review Dg Chest 2 View  05/26/2014   CLINICAL DATA:  Chest pain and dizziness for 1 day  EXAM: CHEST  2 VIEW  COMPARISON:  PA and lateral chest x-ray of November 21, 2008  FINDINGS: The lungs are mildly hyperinflated. There is no pneumonia nor other acute pulmonary parenchymal abnormality. The interstitial markings are coarse likely reflecting the history of tobacco use. New The heart and mediastinal structures are unremarkable. Stable prominence of the soft tissues of the AP window are again demonstrated. There is no pleural effusion or pneumothorax. The bony thorax is unremarkable.  IMPRESSION: There is no acute cardiopulmonary abnormality.   Electronically Signed   By: David  [place]   On: 05/26/2014 15:33     EKG Interpretation   Date/Time:  Wednesday May 26 2014 14:55:36 EDT Ventricular Rate:  62 PR Interval:  124 QRS Duration: 86 QT Interval:  394 QTC Calculation: 399 R Axis:   57 Text Interpretation:  Normal sinus rhythm Normal ECG Confirmed by DELOS   MD, DOUGLAS ([id number]) on 05/26/2014 5:16:36 PM      Heart Score 1 MDM   Final diagnoses:  Chest pain, atypical    Filed Vitals:   05/26/14 1931  BP: 125/58  Pulse: 68  Temp:   Resp: 18    Afebrile, NAD, non-toxic appearing, AAOx4.  Patient is to be discharged with recommendation to follow up with PCP in regards to today's hospital visit. Chest pain is not likely of cardiac or pulmonary etiology d/t presentation, perc negative, VSS, no tracheal deviation, no JVD or new murmur, RRR, breath sounds equal bilaterally, EKG without acute abnormalities, negative troponin, and negative CXR. Pt has been advised start to return to the ED is CP becomes exertional, associated with diaphoresis or nausea, radiates to left jaw/arm, worsens or  becomes concerning in any way. Pt appears reliable for follow up and is agreeable to discharge.    Jeannetta Ellis, PA-C 05/26/14 1952

## 2014-05-26 NOTE — ED Notes (Signed)
Patient feels dizzy when i was sitting her up in bed. Having pains in the middle of chest/breast line area

## 2014-06-03 ENCOUNTER — Encounter (HOSPITAL_COMMUNITY): Payer: Self-pay | Admitting: Emergency Medicine

## 2014-06-03 ENCOUNTER — Emergency Department (HOSPITAL_COMMUNITY)
Admission: EM | Admit: 2014-06-03 | Discharge: 2014-06-03 | Disposition: A | Discharge status: Home / Self Care | Payer: BC Managed Care – PPO | Attending: Emergency Medicine | Admitting: Emergency Medicine

## 2014-06-03 DIAGNOSIS — R079 Chest pain, unspecified: Secondary | ICD-10-CM | POA: Insufficient documentation

## 2014-06-03 DIAGNOSIS — F17200 Nicotine dependence, unspecified, uncomplicated: Secondary | ICD-10-CM | POA: Insufficient documentation

## 2014-06-03 DIAGNOSIS — R011 Cardiac murmur, unspecified: Secondary | ICD-10-CM | POA: Insufficient documentation

## 2014-06-03 DIAGNOSIS — Z791 Long term (current) use of non-steroidal anti-inflammatories (NSAID): Secondary | ICD-10-CM | POA: Insufficient documentation

## 2014-06-03 DIAGNOSIS — F411 Generalized anxiety disorder: Secondary | ICD-10-CM | POA: Insufficient documentation

## 2014-06-03 DIAGNOSIS — F329 Major depressive disorder, single episode, unspecified: Secondary | ICD-10-CM | POA: Insufficient documentation

## 2014-06-03 DIAGNOSIS — F3289 Other specified depressive episodes: Secondary | ICD-10-CM | POA: Insufficient documentation

## 2014-06-03 LAB — BASIC METABOLIC PANEL
ANION GAP: 12 (ref 5–15)
BUN: 7 mg/dL (ref 6–23)
CALCIUM: 9.5 mg/dL (ref 8.4–10.5)
CO2: 24 mEq/L (ref 19–32)
CREATININE: 0.89 mg/dL (ref 0.50–1.10)
Chloride: 104 mEq/L (ref 96–112)
GFR calc Af Amer: >90 mL/min (ref >90)
GFR calc non Af Amer: 82 mL/min — ABNORMAL LOW (ref >90)
GLUCOSE: 82 mg/dL (ref 70–99)
Potassium: 4.1 mEq/L (ref 3.7–5.3)
SODIUM: 140 meq/L (ref 137–147)

## 2014-06-03 LAB — CBC
HCT: 43.5 % (ref 36.0–46.0)
Hemoglobin: 14.7 g/dL (ref 12.0–15.0)
MCH: 31.2 pg (ref 26.0–34.0)
MCHC: 33.8 g/dL (ref 30.0–36.0)
MCV: 92.4 fL (ref 78.0–100.0)
PLATELETS: 241 10*3/uL (ref 150–400)
RBC: 4.71 MIL/uL (ref 3.87–5.11)
RDW: 13.2 % (ref 11.5–15.5)
WBC: 10 10*3/uL (ref 4.0–10.5)

## 2014-06-03 LAB — I-STAT TROPONIN, ED: Troponin i, poc: 0 ng/mL (ref 0.00–0.08)

## 2014-06-03 MED ORDER — OMEPRAZOLE 20 MG PO CPDR: 20.0000 mg | DELAYED_RELEASE_CAPSULE | Freq: Every day | ORAL | Status: DC

## 2014-06-03 NOTE — ED Notes (Addendum)
To ED with c/o chest pain, started today while at work. Pt is a massage therapist. Was seen here for same last week. States pain never goes completely away, states "I may be a little anxious about this too" ,  A/O x3 , skin w/d, no nausea. Has recently moved and has been under more stress than normal per patient

## 2014-06-03 NOTE — ED Provider Notes (Signed)
CSN: [id number]     Arrival date & time 06/03/14  1553 History   This chart was scribed for non-physician practitioner, Elpidio AnisShari Holten Spano, PA-C, working with Samuel JesterKathleen McManus, DO by Milly JakobJohn Lee Graves, ED Scribe. The patient was seen in room TR10C/TR10C. Patient's care was started at 6:26 PM.     Chief Complaint  Patient presents with  . Chest Pain   The history is provided by the patient and medical records. No language interpreter was used.   HPI Comments: Anita Ball is a 38 y.o. female who presents to the Emergency Department complaining of Intermittent, sharp, "squeezing," left sided, chest pain which occurred today while she was at work. She reports associated episodes of sweating profusely. She reports that she was seen here for similar pain last week, and admits that her visit today might be related to anxiety. She states that eating does not make the pain worse, and denies any stomach problems in the past. She denies abdominal pain. She reports that she was born with a mild heart murmur, and states that three years ago she was diagnosed with a sinus arrythmia. She is a smoker. She denies travel or long periods sitting, and she denies exogenous estrogens. She reports strenuous work at her job.  She denies family history of heart disease. She denies taking any medications.  Past Medical History  Diagnosis Date  . Anxiety   . Depression    History reviewed. No pertinent past surgical history. No family history on file. History  Substance Use Topics  . Smoking status: Current Every Day Smoker  . Smokeless tobacco: Not on file  . Alcohol Use: No   OB History   Grav Para Term Preterm Abortions TAB SAB Ect Mult Living                 Review of Systems  Cardiovascular: Positive for chest pain.  All other systems reviewed and are negative.   Allergies  Review of patient's allergies indicates no known allergies.  Home Medications   Prior to Admission medications   Medication Sig  Start Date End Date Taking? Authorizing Provider  ibuprofen (ADVIL,MOTRIN) 200 MG tablet Take 200 mg by mouth daily.    Historical Provider, MD  traMADol (ULTRAM) 50 MG tablet Take 1 tablet (50 mg total) by mouth every 6 (six) hours as needed. 05/26/14   Lise AuerJennifer L Piepenbrink, PA-C   Triage Vitals: BP 111/71  Pulse 69  Temp(Src) 98.2 F (36.8 C) (Oral)  Resp 15  SpO2 100%  LMP 05/12/2014 Physical Exam  Nursing note and vitals reviewed. Constitutional: She is oriented to person, place, and time. She appears well-developed and well-nourished. No distress.  HENT:  Head: Normocephalic and atraumatic.  Eyes: Conjunctivae and EOM are normal.  Neck: Neck supple. No tracheal deviation present.  Cardiovascular: Normal rate and regular rhythm.   Pulmonary/Chest: Effort normal. No respiratory distress. She exhibits no tenderness.  Abdominal: Soft. There is no tenderness.  Musculoskeletal: Normal range of motion.  Neurological: She is alert and oriented to person, place, and time.  Skin: Skin is warm and dry.  Psychiatric: She has a normal mood and affect. Her behavior is normal.    ED Course  Procedures (including critical care time) DIAGNOSTIC STUDIES: Oxygen Saturation is 100% on room air, normal by my interpretation.    COORDINATION OF CARE: 6:33 PM-Discussed treatment plan with pt at bedside and pt agreed to plan.   Labs Review Labs Reviewed  BASIC METABOLIC PANEL - Abnormal;  Notable for the following:    GFR calc non Af Amer 82 (*)    All other components within normal limits  CBC  I-STAT TROPOININ, ED   Results for orders placed during the hospital encounter of 06/03/14  CBC      Result Value Ref Range   WBC 10.0  4.0 - 10.5 K/uL   RBC 4.71  3.87 - 5.11 MIL/uL   Hemoglobin 14.7  12.0 - 15.0 g/dL   HCT 16.1  09.6 - 04.5 %   MCV 92.4  78.0 - 100.0 fL   MCH 31.2  26.0 - 34.0 pg   MCHC 33.8  30.0 - 36.0 g/dL   RDW 40.9  81.1 - 91.4 %   Platelets 241  150 - 400 K/uL   BASIC METABOLIC PANEL      Result Value Ref Range   Sodium 140  137 - 147 mEq/L   Potassium 4.1  3.7 - 5.3 mEq/L   Chloride 104  96 - 112 mEq/L   CO2 24  19 - 32 mEq/L   Glucose, Bld 82  70 - 99 mg/dL   BUN 7  6 - 23 mg/dL   Creatinine, Ser 7.82  0.50 - 1.10 mg/dL   Calcium 9.5  8.4 - 95.6 mg/dL   GFR calc non Af Amer 82 (*) >90 mL/min   GFR calc Af Amer >90  >90 mL/min   Anion gap 12  5 - 15  I-STAT TROPOININ, ED      Result Value Ref Range   Troponin i, poc 0.00  0.00 - 0.08 ng/mL   Comment 3             Imaging Review No results found.   EKG Interpretation None      MDM   Final diagnoses:  None    1. Chest pain  Second visit for constant chest pain, negative lab studies. No SOB or cough. Doubt ACS with consistently negative troponins and atypical symptoms. Perc negative, no tachycardia - doubt PE. Likely patient's symptoms are GI related, possibly stress/anxiety. Will do trial of Prilosec and refer to PCP for further evaluation if symptoms persist.  I personally performed the services described in this documentation, which was scribed in my presence. The recorded information has been reviewed and is accurate.     Arnoldo Hooker, PA-C 06/03/14 1853

## 2014-06-03 NOTE — ED Provider Notes (Signed)
Medical screening examination/treatment/procedure(s) were performed by non-physician practitioner and as supervising physician I was immediately available for consultation/collaboration.   EKG Interpretation None        Samuel JesterKathleen Bray Vickerman, DO 06/03/14 2353

## 2014-06-03 NOTE — Discharge Instructions (Signed)

## 2014-06-21 ENCOUNTER — Ambulatory Visit: Payer: BC Managed Care – PPO | Admitting: Cardiology

## 2014-07-19 ENCOUNTER — Encounter: Payer: Self-pay | Admitting: Cardiology

## 2014-07-19 ENCOUNTER — Ambulatory Visit (INDEPENDENT_AMBULATORY_CARE_PROVIDER_SITE_OTHER): Payer: BC Managed Care – PPO | Admitting: Cardiology

## 2014-07-19 VITALS — BP 98/68 | HR 55 | Ht 63.0 in | Wt 175.6 lb

## 2014-07-19 DIAGNOSIS — Z720 Tobacco use: Secondary | ICD-10-CM | POA: Insufficient documentation

## 2014-07-19 DIAGNOSIS — Z7189 Other specified counseling: Secondary | ICD-10-CM

## 2014-07-19 DIAGNOSIS — Z716 Tobacco abuse counseling: Secondary | ICD-10-CM

## 2014-07-19 DIAGNOSIS — F17200 Nicotine dependence, unspecified, uncomplicated: Secondary | ICD-10-CM

## 2014-07-19 DIAGNOSIS — R079 Chest pain, unspecified: Secondary | ICD-10-CM | POA: Insufficient documentation

## 2014-07-19 DIAGNOSIS — R011 Cardiac murmur, unspecified: Secondary | ICD-10-CM

## 2014-07-19 NOTE — Patient Instructions (Signed)
Your physician recommends that you continue on your current medications as directed. Please refer to the Current Medication list given to you today.   Your physician has requested that you have an echocardiogram. Echocardiography is a painless test that uses sound waves to create images of your heart. It provides your doctor with information about the size and shape of your heart and how well your heart's chambers and valves are working. This procedure takes approximately one hour. There are no restrictions for this procedure.   Your physician has requested that you have an exercise tolerance test. For further information please visit [email or web address] Please also follow instruction sheet, as given.  DO LABS THE SAME DAY   Your physician recommends that you return for lab work in:  SCHEDULE THIS ON THE SAME DAY AS YOUR EXERCISE TOLERANCE TEST (LIPIDS- PLEASE BE FASTING)   Your physician recommends that you schedule a follow-up appointment in: WITH DR Delton See AFTER YOUR TEST ARE COMPLETE

## 2014-07-19 NOTE — Progress Notes (Signed)
Patient ID: KENYATA NAPIER, female   DOB: Feb 23, 1976, 38 y.o.   MRN: [id number]     Patient Name: CECLIA KOKER Date of Encounter: 07/19/2014  Primary Care Provider:  No PCP Per Patient Primary Cardiologist:  Lars Masson  Problem List   Past Medical History  Diagnosis Date  . Anxiety   . Depression    History reviewed. No pertinent past surgical history.  Allergies  No Known Allergies  HPI  A very pleasant 38 year old female who is coming with concern of chest pain. The patient has been experiencing this pain for about a month. On the first occasion it happen after heavy physical work and she attributes it to musculoskeletal pain. However her pain continued and would last for days. It is sometimes associated with dizziness and shortness of breath. She also states that she has history of anxiety and can't distinguish those two from each other. The patient currently doesn't take any medications. She is an active smoker and smokes about one and half pack a day. She doesn't have any family history of premature coronary artery disease. She doesn't exercise on a regular basis but works as a Teacher, adult education. She has one 108 year old daughter. She denies any palpitations or syncope. She states that her pain would be not related to exercise and will continue through today is upper retrosternal with radiation to her neck and her left chest. The patient also states that she was told she has a murmur and was followed by her pediatrician during her childhood she has never seen a cardiologist before. She has never had an echocardiogram. She was seen in the ER in [place] and was discharged home when they found out her EKG was normal.  Home Medications  Prior to Admission medications   Not on File    Family History  History reviewed. No pertinent family history.  Social History  History   Social History  . Marital Status: Widowed    Spouse Name: N/A    Number of Children:  N/A  . Years of Education: N/A   Occupational History  . Not on file.   Social History Main Topics  . Smoking status: Current Every Day Smoker  . Smokeless tobacco: Not on file  . Alcohol Use: No  . Drug Use: Not on file  . Sexual Activity: Yes   Other Topics Concern  . Not on file   Social History Narrative  . No narrative on file     Review of Systems, as per HPI, otherwise negative General:  No chills, fever, night sweats or weight changes.  Cardiovascular:  No chest pain, dyspnea on exertion, edema, orthopnea, palpitations, paroxysmal nocturnal dyspnea. Dermatological: No rash, lesions/masses Respiratory: No cough, dyspnea Urologic: No hematuria, dysuria Abdominal:   No nausea, vomiting, diarrhea, bright red blood per rectum, melena, or hematemesis Neurologic:  No visual changes, wkns, changes in mental status. All other systems reviewed and are otherwise negative except as noted above.  Physical Exam  Blood pressure 98/68, pulse 55, height  (1.6 m), weight 175 lb 9.6 oz (79.652 kg).  General: Pleasant, NAD Psych: Normal affect. Neuro: Alert and oriented X 3. Moves all extremities spontaneously. HEENT: Normal  Neck: Supple without bruits or JVD. Lungs:  Resp regular and unlabored, CTA. Heart: RRR no s3, s4, holosystolic 3/6 murmur. Abdomen: Soft, non-tender, non-distended, BS + x 4.  Extremities: No clubbing, cyanosis or edema. DP/PT/Radials 2+ and equal bilaterally.  Labs:  No results found for this basename:  CKTOTAL, CKMB, TROPONINI,  in the last 72 hours Lab Results  Component Value Date   WBC 10.0 06/03/2014   HGB 14.7 06/03/2014   HCT 43.5 06/03/2014   MCV 92.4 06/03/2014   PLT 241 06/03/2014    No results found for this basename: DDIMER   No components found with this basename: POCBNP,     Component Value Date/Time   NA 140 06/03/2014 1725   K 4.1 06/03/2014 1725   CL 104 06/03/2014 1725   CO2 24 06/03/2014 1725   GLUCOSE 82 06/03/2014 1725   BUN 7 06/03/2014  1725   CREATININE 0.89 06/03/2014 1725   CALCIUM 9.5 06/03/2014 1725   GFRNONAA 82* 06/03/2014 1725   GFRAA >90 06/03/2014 1725   No results found for this basename: CHOL, HDL, LDLCALC, TRIG    Accessory Clinical Findings  Echocardiogram - none  ECG - Sinus rhythm with sinus arrhythmia, 70 beats per minutes, normal EKG    Assessment & Plan  38 year old female  1. atypical chest pain - nonexertional, however retrosternal with radiation to the neck, risk factors include heavy smoking. We'll schedule an exercise treadmill stress test to rule out ischemia.  2. holosystolic murmur - differential includes mitral, tricuspid regurgitation, or VSD, we will order an echocardiogram  3. Smoking - counseling provided.   Lars Masson, MD, [facility] 07/19/2014, 3:55 PM

## 2014-08-25 ENCOUNTER — Telehealth: Payer: Self-pay | Admitting: *Deleted

## 2014-08-25 ENCOUNTER — Other Ambulatory Visit (INDEPENDENT_AMBULATORY_CARE_PROVIDER_SITE_OTHER): Payer: BC Managed Care – PPO | Admitting: *Deleted

## 2014-08-25 ENCOUNTER — Ambulatory Visit (HOSPITAL_COMMUNITY): Payer: BC Managed Care – PPO | Attending: Cardiology | Admitting: Radiology

## 2014-08-25 ENCOUNTER — Ambulatory Visit (INDEPENDENT_AMBULATORY_CARE_PROVIDER_SITE_OTHER): Payer: BC Managed Care – PPO | Admitting: Physician Assistant

## 2014-08-25 ENCOUNTER — Other Ambulatory Visit (HOSPITAL_COMMUNITY): Payer: BC Managed Care – PPO

## 2014-08-25 DIAGNOSIS — R079 Chest pain, unspecified: Secondary | ICD-10-CM

## 2014-08-25 DIAGNOSIS — Z720 Tobacco use: Secondary | ICD-10-CM | POA: Diagnosis not present

## 2014-08-25 DIAGNOSIS — R011 Cardiac murmur, unspecified: Secondary | ICD-10-CM

## 2014-08-25 LAB — LIPID PANEL
Cholesterol: 161 mg/dL (ref 0–200)
HDL: 50.5 mg/dL (ref >39.00)
LDL Cholesterol: 84 mg/dL (ref 0–99)
NonHDL: 110.5
Total CHOL/HDL Ratio: 3
Triglycerides: 133 mg/dL (ref 0.0–149.0)
VLDL: 26.6 mg/dL (ref 0.0–40.0)

## 2014-08-25 NOTE — Telephone Encounter (Signed)
Message copied by Loa SocksMARTIN, IVY M on Wed Aug 25, 2014  2:06 PM ------      Message from: Lars MassonNELSON, KATARINA H      Created: Wed Aug 25, 2014 12:43 PM                   ----- Message -----         From: Beatrice LecherScott T Weaver, PA-C         Sent: 08/25/2014   9:33 AM           To: Lars MassonKatarina H Nelson, MD             ------

## 2014-08-25 NOTE — Progress Notes (Signed)
Exercise Treadmill Test  Pre-Exercise Testing Evaluation Rhythm: normal sinus  Rate: 65 bpm     Test  Exercise Tolerance Test Ordering MD: Tobias AlexanderKatarina Nelson, MD  Interpreting MD: Tereso NewcomerScott Jerald Villalona, PA-C  Unique Test No: 1  Treadmill:  1  Indication for ETT: chest pain - rule out ischemia  Contraindication to ETT: No   Stress Modality: exercise - treadmill  Cardiac Imaging Performed: non   Protocol: standard Bruce - maximal  Max BP:  210/92  Max MPHR (bpm):  183 85% MPR (bpm):  156  MPHR obtained (bpm):  176 % MPHR obtained:  96  Reached 85% MPHR (min:sec):  9:27 Total Exercise Time (min-sec):  10:00  Workload in METS:  11.7 Borg Scale: 18  Reason ETT Terminated:  desired heart rate attained    ST Segment Analysis At Rest: normal ST segments - no evidence of significant ST depression With Exercise: no evidence of significant ST depression  Other Information Arrhythmia:  No Angina during ETT:  absent (0) Quality of ETT:  diagnostic  ETT Interpretation:  normal - no evidence of ischemia by ST analysis  Comments: Good exercise capacity. No chest pain. Normal BP response to exercise. No ST changes to suggest ischemia.   Recommendations: FU with Dr. Tobias AlexanderKatarina Nelson as directed. Signed,  Tereso NewcomerScott Fredrick [place], PA-C   08/25/2014 9:31 AM

## 2014-08-25 NOTE — Telephone Encounter (Signed)
Notified the pt that per Dr Delton SeeNelson her GXT was normal, with good exercise capacity and no ischemia noted.  Pt verbalized understanding and pleased with this news.

## 2014-08-25 NOTE — Progress Notes (Signed)
Echocardiogram performed.  

## 2014-08-25 NOTE — Progress Notes (Signed)
Completely normal stress test with good exercise capacity and no evidence of ischemia. Please let her know, thank you

## 2014-09-15 ENCOUNTER — Ambulatory Visit: Payer: BC Managed Care – PPO | Admitting: Cardiology

## 2014-09-17 ENCOUNTER — Encounter: Payer: Self-pay | Admitting: Cardiology

## 2015-07-31 IMAGING — CR DG CHEST 2V
2 series · 2 of 2 positions shown · non-contrast
Comparison: PA and lateral chest x-ray November 21, 2008

CLINICAL DATA: Chest pain and dizziness for 1 day

EXAM:
CHEST  2 VIEW

[w chest pa]
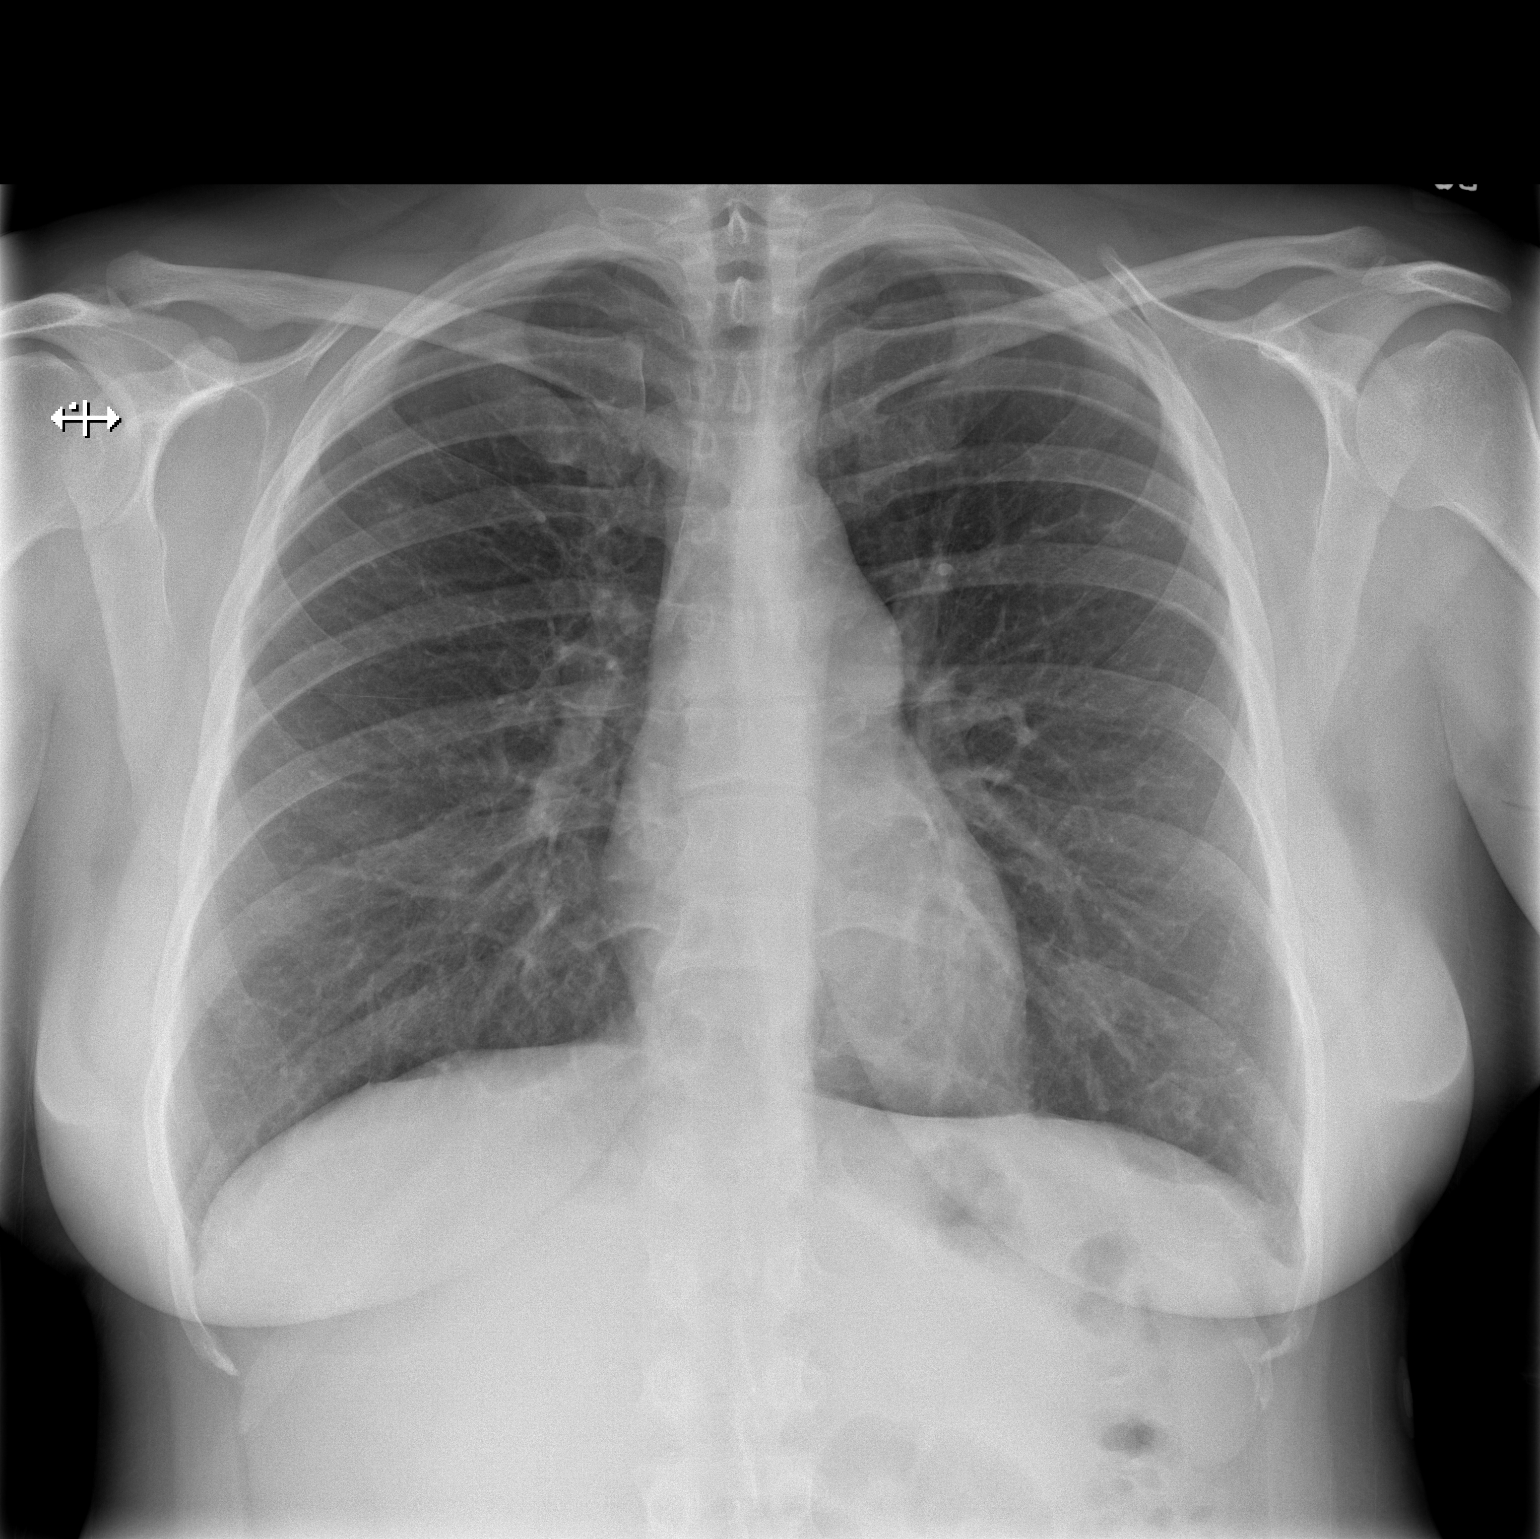

[w chest lat]
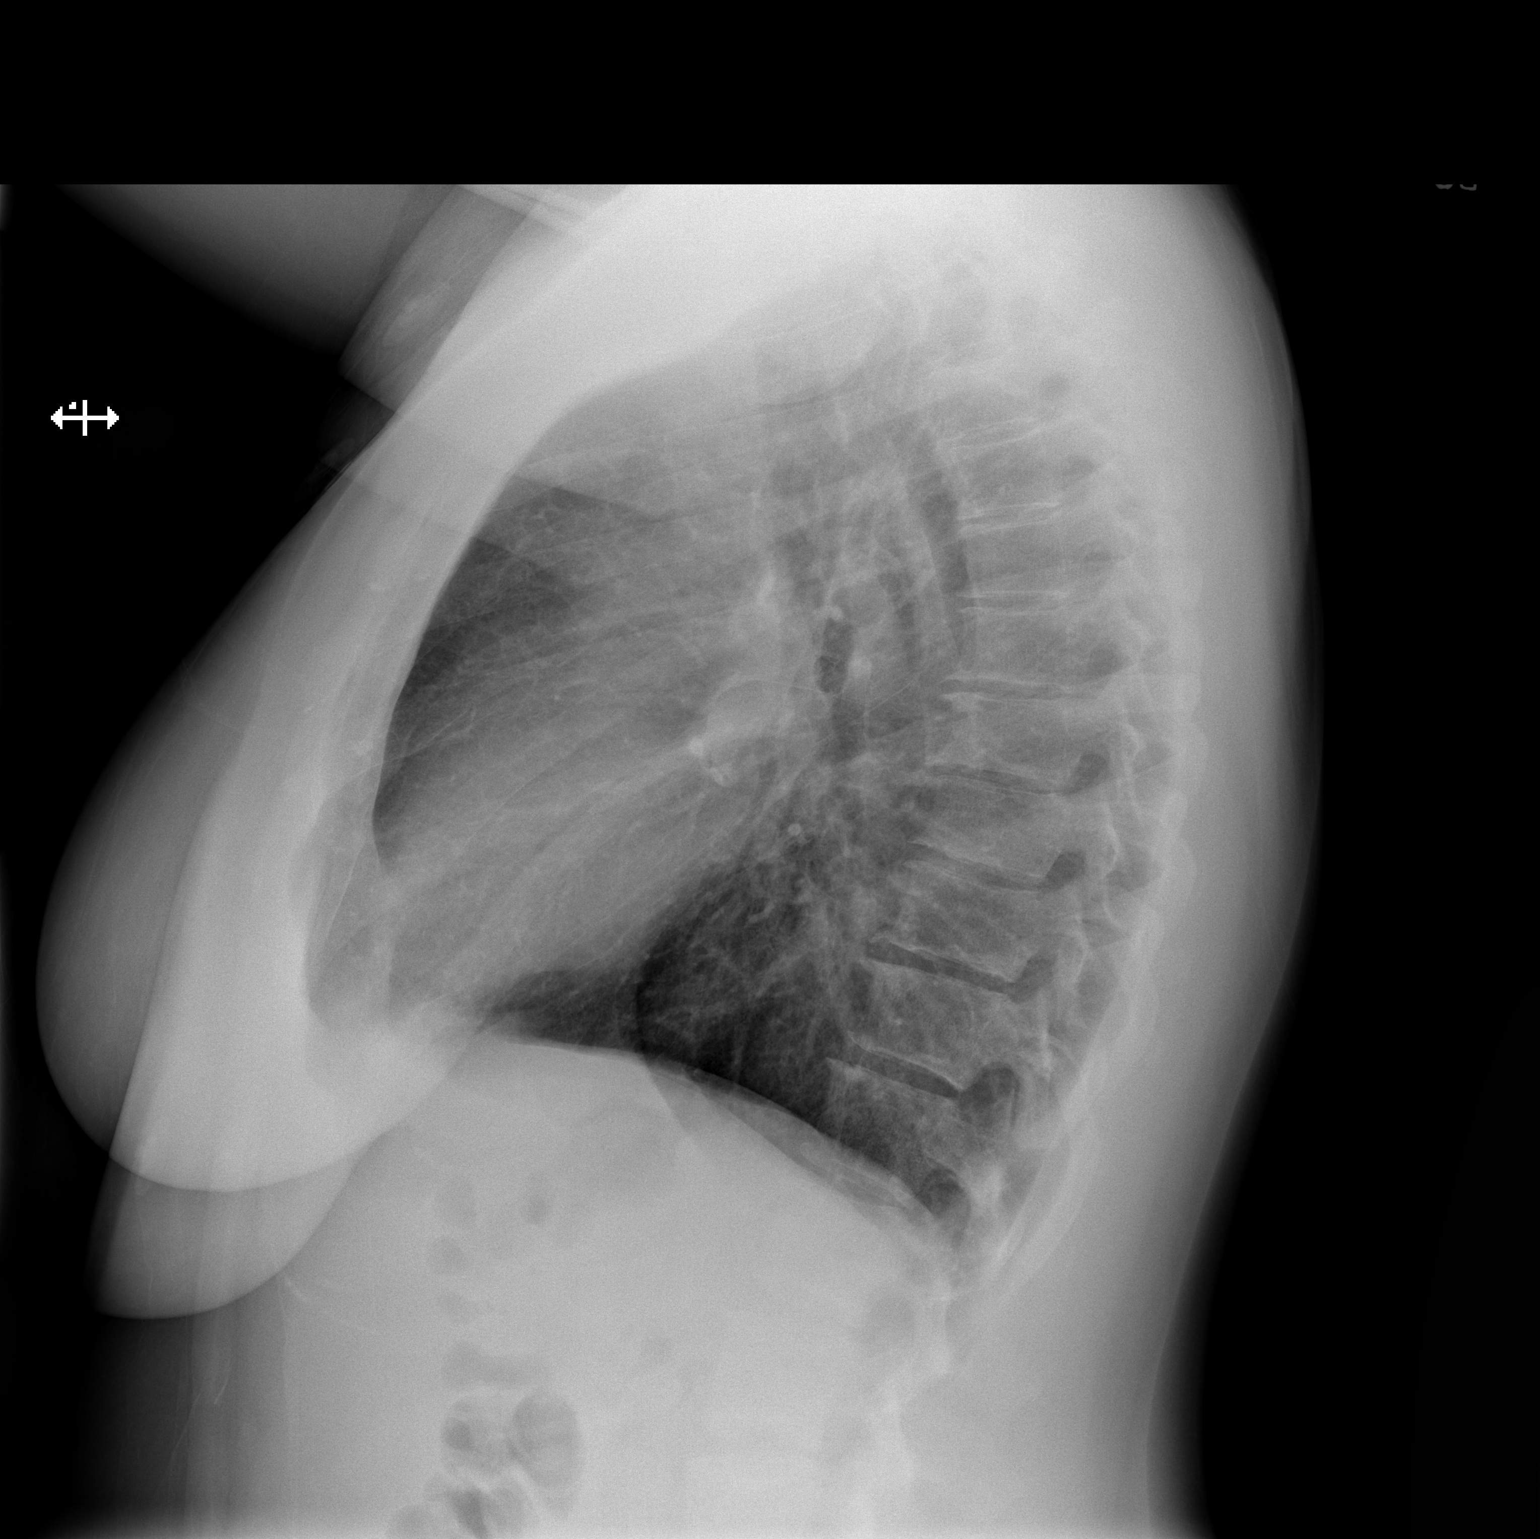

[2 of 2 positions shown; findings below may reference images not displayed]

FINDINGS: The lungs are mildly hyperinflated. There is no pneumonia nor other
acute pulmonary parenchymal abnormality. The interstitial markings
are coarse likely reflecting the history of tobacco use. New The
heart and mediastinal structures are unremarkable. Stable prominence
of the soft tissues of the AP window are again demonstrated. There
is no pleural effusion or pneumothorax. The bony thorax is
unremarkable.
IMPRESSION: There is no acute cardiopulmonary abnormality.

## 2015-09-07 DIAGNOSIS — M79622 Pain in left upper arm: Secondary | ICD-10-CM | POA: Insufficient documentation

## 2015-09-07 DIAGNOSIS — R490 Dysphonia: Secondary | ICD-10-CM | POA: Insufficient documentation

## 2015-09-12 DIAGNOSIS — K2100 Gastro-esophageal reflux disease with esophagitis, without bleeding: Secondary | ICD-10-CM | POA: Insufficient documentation

## 2015-12-07 DIAGNOSIS — J351 Hypertrophy of tonsils: Secondary | ICD-10-CM | POA: Insufficient documentation

## 2016-07-05 ENCOUNTER — Emergency Department (HOSPITAL_COMMUNITY)
Admission: EM | Admit: 2016-07-05 | Discharge: 2016-07-06 | Disposition: A | Discharge status: Home / Self Care | Payer: BLUE CROSS/BLUE SHIELD | Attending: Emergency Medicine | Admitting: Emergency Medicine

## 2016-07-05 ENCOUNTER — Encounter (HOSPITAL_COMMUNITY): Payer: Self-pay | Admitting: Emergency Medicine

## 2016-07-05 DIAGNOSIS — F17200 Nicotine dependence, unspecified, uncomplicated: Secondary | ICD-10-CM | POA: Insufficient documentation

## 2016-07-05 DIAGNOSIS — Z5181 Encounter for therapeutic drug level monitoring: Secondary | ICD-10-CM | POA: Insufficient documentation

## 2016-07-05 DIAGNOSIS — N939 Abnormal uterine and vaginal bleeding, unspecified: Secondary | ICD-10-CM | POA: Diagnosis present

## 2016-07-05 DIAGNOSIS — E860 Dehydration: Secondary | ICD-10-CM | POA: Diagnosis not present

## 2016-07-05 DIAGNOSIS — F430 Acute stress reaction: Secondary | ICD-10-CM | POA: Diagnosis not present

## 2016-07-05 DIAGNOSIS — F439 Reaction to severe stress, unspecified: Secondary | ICD-10-CM

## 2016-07-05 HISTORY — DX: Cardiac murmur, unspecified: R01.1

## 2016-07-05 LAB — RAPID URINE DRUG SCREEN, HOSP PERFORMED
AMPHETAMINES: NOT DETECTED
BENZODIAZEPINES: NOT DETECTED
Barbiturates: NOT DETECTED
COCAINE: NOT DETECTED
OPIATES: NOT DETECTED
TETRAHYDROCANNABINOL: POSITIVE — AB

## 2016-07-05 LAB — URINALYSIS, ROUTINE W REFLEX MICROSCOPIC
BILIRUBIN URINE: NEGATIVE
GLUCOSE, UA: NEGATIVE mg/dL
Ketones, ur: NEGATIVE mg/dL
Leukocytes, UA: NEGATIVE
Nitrite: NEGATIVE
PROTEIN: NEGATIVE mg/dL
Specific Gravity, Urine: 1.004 — ABNORMAL LOW (ref 1.005–1.030)
pH: 6.5 (ref 5.0–8.0)

## 2016-07-05 LAB — BASIC METABOLIC PANEL
Anion gap: 9 (ref 5–15)
BUN: 9 mg/dL (ref 6–20)
CALCIUM: 9.9 mg/dL (ref 8.9–10.3)
CO2: 26 mmol/L (ref 22–32)
CREATININE: 0.82 mg/dL (ref 0.44–1.00)
Chloride: 105 mmol/L (ref 101–111)
GFR calc non Af Amer: >60 mL/min (ref >60)
Glucose, Bld: 117 mg/dL — ABNORMAL HIGH (ref 65–99)
Potassium: 4 mmol/L (ref 3.5–5.1)
Sodium: 140 mmol/L (ref 135–145)

## 2016-07-05 LAB — CBC
HCT: 43.4 % (ref 36.0–46.0)
Hemoglobin: 15 g/dL (ref 12.0–15.0)
MCH: 31.1 pg (ref 26.0–34.0)
MCHC: 34.6 g/dL (ref 30.0–36.0)
MCV: 90 fL (ref 78.0–100.0)
PLATELETS: 304 10*3/uL (ref 150–400)
RBC: 4.82 MIL/uL (ref 3.87–5.11)
RDW: 13.2 % (ref 11.5–15.5)
WBC: 11.9 10*3/uL — ABNORMAL HIGH (ref 4.0–10.5)

## 2016-07-05 LAB — URINE MICROSCOPIC-ADD ON: Bacteria, UA: NONE SEEN

## 2016-07-05 LAB — I-STAT BETA HCG BLOOD, ED (MC, WL, AP ONLY): I-stat hCG, quantitative: 5.4 m[IU]/mL — ABNORMAL HIGH (ref <5)

## 2016-07-05 LAB — PREGNANCY, URINE: Preg Test, Ur: NEGATIVE

## 2016-07-05 MED ORDER — ONDANSETRON HCL 4 MG/2ML IJ SOLN
4.0000 mg | Freq: Once | INTRAMUSCULAR | Status: AC
  Administered 2016-07-05: 4 mg via INTRAVENOUS
  Filled 2016-07-05: qty 2

## 2016-07-05 MED ORDER — SODIUM CHLORIDE 0.9 % IV BOLUS (SEPSIS)
2000.0000 mL | Freq: Once | INTRAVENOUS | Status: AC
  Administered 2016-07-05: 2000 mL via INTRAVENOUS

## 2016-07-05 NOTE — ED Notes (Signed)
Called lab did add on urine drug screen paper sent down

## 2016-07-05 NOTE — ED Triage Notes (Signed)
Pt reports vaginal bleeding for the past 2 weeks. Started having n/v for the past week. Pt reports she has felt lightheadedness and dizziness with this as well. Did not eat until just before arrival. Also endorses palpitations.

## 2016-07-05 NOTE — ED Provider Notes (Signed)
[facility]-EMERGENCY DEPT Provider Note   CSN: [id number] Arrival date & time: 07/05/16  1540  By signing my name below, I, Majel HomerPeyton Lee, attest that this documentation has been prepared under the direction and in the presence of non-physician practitioner, Elpidio AnisShari Maxxwell Edgett, PA-C. Electronically Signed: Majel HomerPeyton Lee, Scribe. 07/05/2016. 9:27 PM.  History   Chief Complaint Chief Complaint  Patient presents with  . Vaginal Bleeding  . Dizziness  . Emesis   The history is provided by the patient. No language interpreter was used.   HPI Comments: Anita Ball is a 40 y.o. female with PMHx of anxiety, depression and heart murmur, who presents to the Emergency Department complaining of gradually worsening, lightheadedness and dizziness that began this afternoon. Pt reports she visited the ED today after feeling like she was about to pass out. She states she has been "highly stressed" lately and has not eaten or slept well this past week. She states associated "mind racing" that is worse at night; she notes this could be due to her working more often recently. Pt notes associated productive cough, nausea, vomiting and intermittent diarrhea.  She denies SI, HI, substance abuse or fever.    Pt also complains of gradually improving, persistent, vaginal bleeding that began 2 weeks ago. She notes she occasionally has spotting but this is very unusual for her to have bleeding for this long. She denies vaginal discharge, dysuria and urinary frequency.   Pt reports pain surrounding a broken tooth in her left upper mouth. She notes she has been taking amoxicillin and another antibiotic that was left over from another treatment for her pain. She reports hx of smoking cigarettes everyday and marijuana "every now and then." She denies EtOH use.    Past Medical History:  Diagnosis Date  . Anxiety   . Depression   . Heart murmur     Patient Active Problem List   Diagnosis Date Noted  . Tobacco abuse 07/19/2014    . Tobacco abuse counseling 07/19/2014  . Chest pain 07/19/2014  . Systolic murmur 07/19/2014    History reviewed. No pertinent surgical history.  OB History    No data available       Home Medications    Prior to Admission medications   Medication Sig Start Date End Date Taking? Authorizing Provider  ibuprofen (ADVIL,MOTRIN) 200 MG tablet Take 400 mg by mouth every 6 (six) hours as needed for moderate pain.   Yes Historical Provider, MD    Family History History reviewed. No pertinent family history.  Social History Social History  Substance Use Topics  . Smoking status: Current Every Day Smoker  . Smokeless tobacco: Never Used  . Alcohol use No     Allergies   Review of patient's allergies indicates no known allergies.   Review of Systems Review of Systems  Constitutional: Positive for appetite change. Negative for fever.  Respiratory: Positive for cough.   Cardiovascular: Negative for chest pain.  Gastrointestinal: Positive for diarrhea, nausea and vomiting.  Genitourinary: Positive for vaginal bleeding. Negative for dysuria, frequency and vaginal discharge.  Musculoskeletal: Negative for arthralgias.  Skin: Negative for color change and wound.  Neurological: Positive for dizziness and light-headedness. Negative for syncope and weakness.  Psychiatric/Behavioral: Negative for suicidal ideas.   Physical Exam Updated Vital Signs BP 124/95 (BP Location: Left Arm)   Pulse 85   Temp 98.9 F (37.2 C) (Oral)   Resp 20   SpO2 100%   Physical Exam  Constitutional: She is oriented to  person, place, and time.  HENT:  Head: Normocephalic and atraumatic.  Dry mucosa  Eyes: Conjunctivae are normal. Pupils are equal, round, and reactive to light. Right eye exhibits no discharge. Left eye exhibits no discharge. No scleral icterus.  Neck: Normal range of motion. No JVD present. No tracheal deviation present.  Cardiovascular:  Murmur heard. Grade 2-3/6 systolic  murmur  Pulmonary/Chest: Effort normal and breath sounds normal. No stridor.  Abdominal: Soft. There is no tenderness.  Neurological: She is alert and oriented to person, place, and time. Coordination normal.  Skin: Skin is warm and dry.  Psychiatric: She has a normal mood and affect. Her behavior is normal. Judgment and thought content normal.  Nursing note and vitals reviewed.  ED Treatments / Results  Labs (all labs ordered are listed, but only abnormal results are displayed) Labs Reviewed  BASIC METABOLIC PANEL - Abnormal; Notable for the following:       Result Value   Glucose, Bld 117 (*)    All other components within normal limits  CBC - Abnormal; Notable for the following:    WBC 11.9 (*)    All other components within normal limits  URINALYSIS, ROUTINE W REFLEX MICROSCOPIC (NOT AT [facility]) - Abnormal; Notable for the following:    Specific Gravity, Urine 1.004 (*)    Hgb urine dipstick MODERATE (*)    All other components within normal limits  URINE MICROSCOPIC-ADD ON - Abnormal; Notable for the following:    Squamous Epithelial / LPF 0-5 (*)    All other components within normal limits  I-STAT BETA HCG BLOOD, ED ([facility], [facility], [facility] ONLY) - Abnormal; Notable for the following:    I-stat hCG, quantitative 5.4 (*)    All other components within normal limits  CBG MONITORING, ED    EKG  EKG Interpretation None       Radiology No results found.  Procedures Procedures  DIAGNOSTIC STUDIES:  Oxygen Saturation is 100% on RA, normal by my interpretation.    COORDINATION OF CARE:  9:22 PM Discussed treatment plan with pt at bedside and pt agreed to plan.  Medications Ordered in ED Medications - No data to display   Initial Impression / Assessment and Plan / ED Course  I have reviewed the triage vital signs and the nursing notes.  Pertinent labs & imaging results that were available during my care of the patient were reviewed by me and considered in my medical decision  making (see chart for details).  Clinical Course  1. Dehydration 2. Stress reaction   Patient presents with dizziness type presyncopal symptoms occurring today. Under significant stress with added symptoms of insomnia, irregular menses, decreased appetite, "mind racing". Denies SI/HI, hallucinations or substance abuse.   She appears dry, likely dehydrated with less PO intake and high stress. Labs reassuring.   She is is feeling significantly improved after IV fluids. Discussed vaginal exam for irregular bleeding and patient reports she will see her doctor.   Stable for discharge home.   I personally performed the services described in this documentation, which was scribed in my presence. The recorded information has been reviewed and is accurate.   Final Clinical Impressions(s) / ED Diagnoses   Final diagnoses:  None    New Prescriptions New Prescriptions   No medications on file     Elpidio Anis, PA-C 07/05/16 2350    Laurence Spates, MD 07/06/16 [phone]

## 2016-07-06 LAB — CBG MONITORING, ED: GLUCOSE-CAPILLARY: 147 mg/dL — AB (ref 65–99)

## 2016-08-17 DIAGNOSIS — H93293 Other abnormal auditory perceptions, bilateral: Secondary | ICD-10-CM | POA: Insufficient documentation

## 2016-08-17 DIAGNOSIS — H6993 Unspecified Eustachian tube disorder, bilateral: Secondary | ICD-10-CM | POA: Insufficient documentation

## 2016-09-25 ENCOUNTER — Emergency Department (HOSPITAL_COMMUNITY)
Admission: EM | Admit: 2016-09-25 | Discharge: 2016-09-26 | Disposition: A | Discharge status: Other Acute Inpatient Hospital | Payer: BLUE CROSS/BLUE SHIELD | Attending: Emergency Medicine | Admitting: Emergency Medicine

## 2016-09-25 ENCOUNTER — Encounter (HOSPITAL_COMMUNITY): Payer: Self-pay

## 2016-09-25 DIAGNOSIS — F333 Major depressive disorder, recurrent, severe with psychotic symptoms: Secondary | ICD-10-CM | POA: Diagnosis present

## 2016-09-25 DIAGNOSIS — F17200 Nicotine dependence, unspecified, uncomplicated: Secondary | ICD-10-CM | POA: Diagnosis not present

## 2016-09-25 DIAGNOSIS — Z5181 Encounter for therapeutic drug level monitoring: Secondary | ICD-10-CM | POA: Insufficient documentation

## 2016-09-25 DIAGNOSIS — F22 Delusional disorders: Secondary | ICD-10-CM

## 2016-09-25 DIAGNOSIS — R443 Hallucinations, unspecified: Secondary | ICD-10-CM

## 2016-09-25 LAB — COMPREHENSIVE METABOLIC PANEL
ALBUMIN: 4.3 g/dL (ref 3.5–5.0)
ALK PHOS: 38 U/L (ref 38–126)
ALT: 27 U/L (ref 14–54)
ANION GAP: 10 (ref 5–15)
AST: 34 U/L (ref 15–41)
BUN: 17 mg/dL (ref 6–20)
CO2: 20 mmol/L — AB (ref 22–32)
Calcium: 9.3 mg/dL (ref 8.9–10.3)
Chloride: 106 mmol/L (ref 101–111)
Creatinine, Ser: 0.86 mg/dL (ref 0.44–1.00)
GFR calc Af Amer: >60 mL/min (ref >60)
GFR calc non Af Amer: >60 mL/min (ref >60)
GLUCOSE: 97 mg/dL (ref 65–99)
POTASSIUM: 3.8 mmol/L (ref 3.5–5.1)
SODIUM: 136 mmol/L (ref 135–145)
Total Bilirubin: 0.9 mg/dL (ref 0.3–1.2)
Total Protein: 7.1 g/dL (ref 6.5–8.1)

## 2016-09-25 LAB — SALICYLATE LEVEL: Salicylate Lvl: <7 mg/dL (ref 2.8–30.0)

## 2016-09-25 LAB — CBC
HEMATOCRIT: 40.8 % (ref 36.0–46.0)
HEMOGLOBIN: 14.7 g/dL (ref 12.0–15.0)
MCH: 31.8 pg (ref 26.0–34.0)
MCHC: 36 g/dL (ref 30.0–36.0)
MCV: 88.3 fL (ref 78.0–100.0)
Platelets: 243 10*3/uL (ref 150–400)
RBC: 4.62 MIL/uL (ref 3.87–5.11)
RDW: 12.8 % (ref 11.5–15.5)
WBC: 12.1 10*3/uL — AB (ref 4.0–10.5)

## 2016-09-25 LAB — ETHANOL: Alcohol, Ethyl (B): <5 mg/dL (ref <5)

## 2016-09-25 LAB — ACETAMINOPHEN LEVEL

## 2016-09-25 NOTE — ED Notes (Signed)
Adele & Tammy SoursGreg are patient's parents.  Their # is [phone] (mom cell) [phone] (home) [phone] (father cell).

## 2016-09-25 NOTE — ED Provider Notes (Signed)
[facility]-EMERGENCY DEPT Provider Note   CSN: [id number] Arrival date & time: 09/25/16  1952     History   Chief Complaint Chief Complaint  Patient presents with  . Hallucinations    HPI Anita Ball is a 40 y.o. female with a past medical history significant for anxiety and depression who presents under involuntary commitment for hallucinations, psychosis, and concern for safety. History is gathered from both the patient's mother and patient. Patient's mother reports that the patient has had "trouble for some time". She reports that she has not been seen by a psychiatrist in many years. She reports that the patient "channel spirits" who tell her that she is going to save the world. She reports that she talks to the "spirits" who tell her what to do. Patient's mother reports that the spirits told her [place] [facility] The car broke down and patient was found lying in a field next to the road.  Patient reports that she does not have suicidal ideation or homicidal ideation at this time. She does report that she is continuing to hear the spirits talk to her during initial interview. She reports that she can "see the energy" and then began speaking unintelligibly. He then reports that she is related to the [place] going [place].   Patient denies any physical complaints including no fevers, chills, chest pain, shortness of breath, nausea, vomiting, constipation, diarrhea, dysuria. Patient reports that she is scheduled to have a tooth pulled in the near future due to continued tooth pain.   Due to worsening hallucinations, worsened delusions, and dangerous behavior, patient was placed under involuntary commitment by the family.   The history is provided by the patient, a parent and medical records. The history is limited by the condition of the patient. No language interpreter was used.  Mental Health Problem  Presenting symptoms: bizarre behavior and  hallucinations   Presenting symptoms: no agitation, no suicidal thoughts, no suicidal threats and no suicide attempt   Patient accompanied by:  Parent Degree of incapacity (severity):  Severe Onset quality:  Gradual Duration:  1 week (worsening over last week) Timing:  Constant Progression:  Worsening Chronicity:  Chronic Context: not alcohol use and not drug abuse   Treatment compliance:  Untreated Relieved by:  Nothing Worsened by:  Nothing Ineffective treatments:  None tried Associated symptoms: no abdominal pain, no chest pain, no fatigue and no headaches       Past Medical History:  Diagnosis Date  . Anxiety   . Depression   . Heart murmur     Patient Active Problem List   Diagnosis Date Noted  . Tobacco abuse 07/19/2014  . Tobacco abuse counseling 07/19/2014  . Chest pain 07/19/2014  . Systolic murmur 07/19/2014    History reviewed. No pertinent surgical history.  OB History    No data available       Home Medications    Prior to Admission medications   Medication Sig Start Date End Date Taking? Authorizing Provider  amoxicillin (AMOXIL) 500 MG capsule Take 500 mg by mouth every 6 (six) hours. 09/17/16  Yes Historical Provider, MD    Family History History reviewed. No pertinent family history.  Social History Social History  Substance Use Topics  . Smoking status: Current Every Day Smoker  . Smokeless tobacco: Never Used  . Alcohol use No     Allergies   Patient has no known allergies.   Review of Systems  Review of Systems  Constitutional: Negative for activity change, chills, diaphoresis, fatigue and fever.  HENT: Negative for congestion and rhinorrhea.   Eyes: Negative for visual disturbance.  Respiratory: Negative for cough, chest tightness, shortness of breath and stridor.   Cardiovascular: Negative for chest pain, palpitations and leg swelling.  Gastrointestinal: Negative for abdominal distention, abdominal pain, constipation,  diarrhea, nausea and vomiting.  Genitourinary: Negative for difficulty urinating, dysuria, flank pain, frequency, hematuria, menstrual problem, pelvic pain, vaginal bleeding and vaginal discharge.  Musculoskeletal: Negative for back pain and neck pain.  Skin: Negative for rash and wound.  Neurological: Negative for dizziness, weakness, light-headedness, numbness and headaches.  Psychiatric/Behavioral: Positive for behavioral problems and hallucinations. Negative for agitation, confusion and suicidal ideas.  All other systems reviewed and are negative.    Physical Exam Updated Vital Signs BP (!) 112/52 (BP Location: Left Arm)   Temp 98.8 F (37.1 C) (Oral)   Resp 16   SpO2 97%   Physical Exam  Constitutional: She appears well-developed and well-nourished. No distress.  HENT:  Head: Normocephalic and atraumatic.  Mouth/Throat: Oropharynx is clear and moist. No oropharyngeal exudate.  Eyes: Conjunctivae and EOM are normal. Pupils are equal, round, and reactive to light.  Neck: Normal range of motion. Neck supple.  Cardiovascular: Normal rate and regular rhythm.   No murmur heard. Pulmonary/Chest: Effort normal and breath sounds normal. No respiratory distress.  Abdominal: Soft. There is no tenderness.  Musculoskeletal: She exhibits no edema.  Neurological: She is alert.  Skin: Skin is warm and dry.  Psychiatric: Her speech is tangential. She is actively hallucinating. She is not agitated and not hyperactive. Thought content is delusional.  Nursing note and vitals reviewed.    ED Treatments / Results  Labs (all labs ordered are listed, but only abnormal results are displayed) Labs Reviewed  COMPREHENSIVE METABOLIC PANEL - Abnormal; Notable for the following:       Result Value   CO2 20 (*)    All other components within normal limits  ACETAMINOPHEN LEVEL - Abnormal; Notable for the following:    Acetaminophen (Tylenol), Serum <10 (*)    All other components within normal  limits  CBC - Abnormal; Notable for the following:    WBC 12.1 (*)    All other components within normal limits  URINALYSIS, ROUTINE W REFLEX MICROSCOPIC (NOT AT [facility]) - Abnormal; Notable for the following:    Hgb urine dipstick SMALL (*)    Ketones, ur >80 (*)    All other components within normal limits  URINE MICROSCOPIC-ADD ON - Abnormal; Notable for the following:    Squamous Epithelial / LPF 0-5 (*)    Bacteria, UA RARE (*)    All other components within normal limits  ETHANOL  SALICYLATE LEVEL  RAPID URINE DRUG SCREEN, HOSP PERFORMED  POC URINE PREG, ED    EKG  EKG Interpretation None       Radiology No results found.  Procedures Procedures (including critical care time)  Medications Ordered in ED Medications - No data to display   Initial Impression / Assessment and Plan / ED Course  I have reviewed the triage vital signs and the nursing notes.  Pertinent labs & imaging results that were available during my care of the patient were reviewed by me and considered in my medical decision making (see chart for details).  Clinical Course     Anita Ball is a 40 y.o. female with a past medical history significant for anxiety and  depression who presents under involuntary commitment for hallucinations, psychosis, and concern for safety.  History and exam are seen above.  On exam, patient denies any SI, or HI but does report hallucinations. Patient is answering questions appropriately and she is a hospital. Patient has no chest pain, abdominal pain, back pain, or focal neurologic deficits. Patient does not have any tooth tenderness or maxillary tenderness. No evidence of dental abscess.  Due to continued and worsening delusions and hallucinations, patient will require evaluation by psychiatry. Patient is medically cleared for further psychiatric management.    Final Clinical Impressions(s) / ED Diagnoses   Final diagnoses:  Hallucinations  Delusions (HCC)      Clinical Impression: 1. Hallucinations   2. Delusions [facility](HCC)     Disposition: Awaiting psychiatric recommendations    Heide Scaleshristopher J Tegeler, MD 09/26/16 1216

## 2016-09-25 NOTE — ED Triage Notes (Signed)
Patient BIB by [facility] after IVC by her parents.  Patient has been in contact with "spirits."  Parents state this is new behavior for patient.

## 2016-09-26 ENCOUNTER — Inpatient Hospital Stay (HOSPITAL_COMMUNITY)
Admission: AD | Admit: 2016-09-26 | Discharge: 2016-10-03 | DRG: 885 | Disposition: A | Discharge status: Home / Self Care | Payer: BLUE CROSS/BLUE SHIELD | Attending: Psychiatry | Admitting: Psychiatry

## 2016-09-26 ENCOUNTER — Encounter (HOSPITAL_COMMUNITY): Payer: Self-pay

## 2016-09-26 DIAGNOSIS — Z79899 Other long term (current) drug therapy: Secondary | ICD-10-CM | POA: Diagnosis not present

## 2016-09-26 DIAGNOSIS — F333 Major depressive disorder, recurrent, severe with psychotic symptoms: Secondary | ICD-10-CM | POA: Diagnosis present

## 2016-09-26 DIAGNOSIS — F29 Unspecified psychosis not due to a substance or known physiological condition: Secondary | ICD-10-CM | POA: Diagnosis present

## 2016-09-26 DIAGNOSIS — Z811 Family history of alcohol abuse and dependence: Secondary | ICD-10-CM | POA: Diagnosis not present

## 2016-09-26 DIAGNOSIS — Z818 Family history of other mental and behavioral disorders: Secondary | ICD-10-CM | POA: Diagnosis not present

## 2016-09-26 DIAGNOSIS — F17210 Nicotine dependence, cigarettes, uncomplicated: Secondary | ICD-10-CM | POA: Diagnosis present

## 2016-09-26 DIAGNOSIS — G4700 Insomnia, unspecified: Secondary | ICD-10-CM | POA: Diagnosis present

## 2016-09-26 DIAGNOSIS — F410 Panic disorder [episodic paroxysmal anxiety] without agoraphobia: Secondary | ICD-10-CM | POA: Diagnosis present

## 2016-09-26 DIAGNOSIS — F250 Schizoaffective disorder, bipolar type: Secondary | ICD-10-CM | POA: Clinically undetermined

## 2016-09-26 DIAGNOSIS — F411 Generalized anxiety disorder: Secondary | ICD-10-CM | POA: Diagnosis present

## 2016-09-26 LAB — PREGNANCY, URINE: Preg Test, Ur: NEGATIVE

## 2016-09-26 LAB — URINALYSIS, ROUTINE W REFLEX MICROSCOPIC
Bilirubin Urine: NEGATIVE
GLUCOSE, UA: NEGATIVE mg/dL
LEUKOCYTES UA: NEGATIVE
Nitrite: NEGATIVE
PH: 5.5 (ref 5.0–8.0)
PROTEIN: NEGATIVE mg/dL
Specific Gravity, Urine: 1.014 (ref 1.005–1.030)

## 2016-09-26 LAB — RAPID URINE DRUG SCREEN, HOSP PERFORMED
Amphetamines: NOT DETECTED
BARBITURATES: NOT DETECTED
BENZODIAZEPINES: NOT DETECTED
COCAINE: NOT DETECTED
OPIATES: NOT DETECTED
TETRAHYDROCANNABINOL: NOT DETECTED

## 2016-09-26 LAB — URINE MICROSCOPIC-ADD ON

## 2016-09-26 MED ORDER — ASENAPINE MALEATE 5 MG SL SUBL
5.0000 mg | SUBLINGUAL_TABLET | Freq: Two times a day (BID) | SUBLINGUAL | Status: DC
  Administered 2016-09-26: 5 mg via SUBLINGUAL
  Filled 2016-09-26 (×5): qty 1

## 2016-09-26 MED ORDER — ALUM & MAG HYDROXIDE-SIMETH 200-200-20 MG/5ML PO SUSP: 30.0000 mL | ORAL | Status: DC | PRN

## 2016-09-26 MED ORDER — CITALOPRAM HYDROBROMIDE 10 MG PO TABS
10.0000 mg | ORAL_TABLET | Freq: Every day | ORAL | Status: DC
  Filled 2016-09-26 (×2): qty 1

## 2016-09-26 MED ORDER — CITALOPRAM HYDROBROMIDE 10 MG PO TABS
10.0000 mg | ORAL_TABLET | Freq: Every day | ORAL | Status: DC
Start: 2016-09-26 — End: 2016-09-26
  Filled 2016-09-26: qty 1

## 2016-09-26 MED ORDER — AMOXICILLIN 500 MG PO CAPS
500.0000 mg | ORAL_CAPSULE | Freq: Three times a day (TID) | ORAL | Status: DC
  Administered 2016-09-26 – 2016-10-03 (×27): 500 mg via ORAL
  Filled 2016-09-26 (×15): qty 1
  Filled 2016-09-26: qty 2
  Filled 2016-09-26: qty 1
  Filled 2016-09-26: qty 2
  Filled 2016-09-26 (×18): qty 1

## 2016-09-26 MED ORDER — ASENAPINE MALEATE 5 MG SL SUBL
5.0000 mg | SUBLINGUAL_TABLET | Freq: Two times a day (BID) | SUBLINGUAL | Status: DC
  Filled 2016-09-26: qty 1

## 2016-09-26 MED ORDER — MAGNESIUM HYDROXIDE 400 MG/5ML PO SUSP: 30.0000 mL | Freq: Every day | ORAL | Status: DC | PRN

## 2016-09-26 MED ORDER — ACETAMINOPHEN 325 MG PO TABS: 650.0000 mg | ORAL_TABLET | Freq: Four times a day (QID) | ORAL | Status: DC | PRN

## 2016-09-26 NOTE — ED Notes (Signed)
Pt is aware urine sample is needed. 

## 2016-09-26 NOTE — Progress Notes (Signed)
09/26/16 1348:  LRT introduced self to pt and offered activities.  Pt was lying down but awake.  Pt declined activities stating she just wanted to lay down and meditate.   Caroll RancherMarjette Jonnatan Hanners, LRT/CTRS

## 2016-09-26 NOTE — Progress Notes (Signed)
Adult Psychoeducational Group Note  Date:  09/26/2016 Time:  10:31 PM  Group Topic/Focus:  Wrap-Up Group:   The focus of this group is to help patients review their daily goal of treatment and discuss progress on daily workbooks.   Participation Level:  Active  Participation Quality:  Appropriate  Affect:  Appropriate  Cognitive:  Alert  Insight: Appropriate  Engagement in Group:  Engaged  Modes of Intervention:  Discussion  Additional Comments:  Patient states, "I had a pretty good day".  Dalayla Aldredge L Ilaisaane Marts 09/26/2016, 10:31 PM

## 2016-09-26 NOTE — Progress Notes (Signed)
Nursing Progress Note 7p-7a  D) Patient presents euphoric and anxious. Patient reports having a good day. Patient states she is tired because she "feels other people's energy". Patient states she "channels good spirits" and is overwhelmed because she can "hear other people's thoughts". Patient denies SI/HI or pain. When asked if patient experiences AVH she states "I don't know what that means because I am gifted, I channel spirits". Patient has no complaints at this time. Patient is pleasant and cooperative. Patient contracts for safety at this time.  A) Emotional support given. Patient medicated as prescribed. Patient on q15 min safety checks. Opportunities for questions or concerns presented to patient. Patient encouraged to continue to get rest and prepare for tomorrow.  R) Patient receptive to interaction with nurse. Patient remains safe on the unit at this time. Patient is resting in bed without complaints. Will continue to monitor.

## 2016-09-26 NOTE — Plan of Care (Signed)
Problem: Safety: Goal: Periods of time without injury will increase Outcome: Progressing Patient remains safe on the unit at this time. Patient on q15 min safety checks. Environment secured. VSS.
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
## 2016-09-26 NOTE — BHH Counselor (Signed)
Clinican attempted to contact pt's mother to gather further information of the pt however clinician was unable to leave a voicemail.   Anita Passereylese D Bennett, MS, [facility], [facility] Triage Specialist [phone]
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
## 2016-09-26 NOTE — ED Notes (Signed)
Patient talking to herself and crying. When asked why she was upset she said I'm just trying to clean myself out.
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
## 2016-09-26 NOTE — Tx Team (Signed)
Initial Treatment Plan 09/26/2016 7:08 PM Eloise HarmanKatherine L Birchmeier VFI:[id number]    PATIENT STRESSORS: Financial difficulties Occupational concerns   PATIENT STRENGTHS: Average or above average intelligence General fund of knowledge Physical Health   PATIENT IDENTIFIED PROBLEMS: Psychosis  Anxiety  "I want to be able to work through my own stuff"  "Be aware of my own journey in life"               DISCHARGE CRITERIA:  Improved stabilization in mood, thinking, and/or behavior Verbal commitment to aftercare and medication compliance  PRELIMINARY DISCHARGE PLAN: Outpatient therapy Medication management  PATIENT/FAMILY INVOLVEMENT: This treatment plan has been presented to and reviewed with the patient, Eloise HarmanKatherine L Hellen.  The patient and family have been given the opportunity to ask questions and make suggestions.  Levin BaconHeather V Eldo Umanzor, RN 09/26/2016, 7:08 PM

## 2016-09-26 NOTE — Progress Notes (Signed)
  ED CM left pt uninsured [place] resources in her locker #35 CM spoke with pt who [place] [facility] as listed pcp Last seen early November 2017 .  CM provided written information to assist pt with determining choice for uninsured accepting pcps, discussed the importance of pcp vs EDP services for f/u care, www.needymeds.org, www.goodrx.com, discounted pharmacies and other [place] resources such as [facility] , [facility], affordable care act, financial assistance, uninsured dental services, [place] med assist, DSS and  health department  Provided resources for [place] uninsured accepting pcps like Jovita KussmaulEvans [place], family medicine at [address], [facility], [facility], local urgent care centers, [facility], [facility], general medical clinics, family services of the [place], [facility] urgent care plus others, medication resources, [facility] out patient pharmacies and housing Provided [facility] contact information

## 2016-09-26 NOTE — ED Notes (Signed)
[facility] [employer] [facility] and ambulatory with IVC papers, EMTALA and personal belongings given to officer. 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 Denied SI/HI.

## 2016-09-27 ENCOUNTER — Encounter (HOSPITAL_COMMUNITY): Payer: Self-pay | Admitting: Psychiatry

## 2016-09-27 DIAGNOSIS — F250 Schizoaffective disorder, bipolar type: Secondary | ICD-10-CM | POA: Clinically undetermined

## 2016-09-27 MED ORDER — HYDROXYZINE HCL 25 MG PO TABS
25.0000 mg | ORAL_TABLET | Freq: Four times a day (QID) | ORAL | Status: DC | PRN
  Administered 2016-09-28: 25 mg via ORAL
  Filled 2016-09-27 (×2): qty 1

## 2016-09-27 MED ORDER — OLANZAPINE 5 MG PO TABS: 5.0000 mg | ORAL_TABLET | Freq: Three times a day (TID) | ORAL | Status: DC | PRN

## 2016-09-27 MED ORDER — LAMOTRIGINE 25 MG PO TABS
25.0000 mg | ORAL_TABLET | Freq: Every evening | ORAL | Status: DC
  Administered 2016-09-27 – 2016-09-29 (×3): 25 mg via ORAL
  Filled 2016-09-27 (×5): qty 1

## 2016-09-27 MED ORDER — OLANZAPINE 10 MG IM SOLR: 5.0000 mg | Freq: Three times a day (TID) | INTRAMUSCULAR | Status: DC | PRN

## 2016-09-27 MED ORDER — ARIPIPRAZOLE 5 MG PO TABS
ORAL_TABLET | ORAL | Status: AC
  Filled 2016-09-27: qty 1

## 2016-09-27 MED ORDER — ARIPIPRAZOLE 5 MG PO TABS
5.0000 mg | ORAL_TABLET | Freq: Every day | ORAL | Status: DC
  Administered 2016-09-27 – 2016-09-28 (×2): 5 mg via ORAL
  Filled 2016-09-27 (×3): qty 1

## 2016-09-27 MED ORDER — TRAZODONE HCL 50 MG PO TABS: 50.0000 mg | ORAL_TABLET | Freq: Every evening | ORAL | Status: DC | PRN

## 2016-09-27 NOTE — Tx Team (Signed)
Interdisciplinary Treatment and Diagnostic Plan Update  09/27/2016 Time of Session: 5:00 PM  Anita Ball MRN: [id number]  Principal Diagnosis: Schizoaffective disorder, bipolar type [facility])  Secondary Diagnoses: Principal Problem:   Schizoaffective disorder, bipolar type ([place])   Current Medications:  Current Facility-Administered Medications  Medication Dose Route Frequency Provider Last Rate Last Dose  . acetaminophen (TYLENOL) tablet 650 mg  650 mg Oral Q6H PRN Patrecia Pour, NP      . alum & mag hydroxide-simeth (MAALOX/MYLANTA) 200-200-20 MG/5ML suspension 30 mL  30 mL Oral Q4H PRN Patrecia Pour, NP      . amoxicillin (AMOXIL) capsule 500 mg  500 mg Oral TID WC & HS Patrecia Pour, NP   500 mg at 09/27/16 1200  . ARIPiprazole (ABILIFY) tablet 5 mg  5 mg Oral Daily Ursula Alert, MD   5 mg at 09/27/16 1526  . hydrOXYzine (ATARAX/VISTARIL) tablet 25 mg  25 mg Oral Q6H PRN Ursula Alert, MD      . lamoTRIgine (LAMICTAL) tablet 25 mg  25 mg Oral QPM Saramma Eappen, MD      . magnesium hydroxide (MILK OF MAGNESIA) suspension 30 mL  30 mL Oral Daily PRN Patrecia Pour, NP      . OLANZapine (ZYPREXA) tablet 5 mg  5 mg Oral TID PRN Ursula Alert, MD       Or  . OLANZapine (ZYPREXA) injection 5 mg  5 mg Intramuscular TID PRN Ursula Alert, MD      . traZODone (DESYREL) tablet 50 mg  50 mg Oral QHS PRN Ursula Alert, MD        PTA Medications: Facility-Administered Medications Prior to Admission  Medication Dose Route Frequency Provider Last Rate Last Dose  . gi cocktail (Maalox,Lidocaine,Donnatal)  30 mL Oral Once Darreld Mclean, MD       Prescriptions Prior to Admission  Medication Sig Dispense Refill Last Dose  . amoxicillin (AMOXIL) 500 MG capsule Take 500 mg by mouth every 6 (six) hours.  0 09/24/2016    Treatment Modalities: Medication Management, Group therapy, Case management,  1 to 1 session with clinician, Psychoeducation, Recreational therapy.   Physician  Treatment Plan for Primary Diagnosis: Schizoaffective disorder, bipolar type ([place]) Long Term Goal(s): Improvement in symptoms so as ready for discharge  Short Term Goals: Ability to identify changes in lifestyle to reduce recurrence of condition will improve   Medication Management: Evaluate patient's response, side effects, and tolerance of medication regimen.  Therapeutic Interventions: 1 to 1 sessions, Unit Group sessions and Medication administration.  Evaluation of Outcomes: Progressing  Physician Treatment Plan for Secondary Diagnosis: Principal Problem:   Schizoaffective disorder, bipolar type ([place])   Long Term Goal(s): Improvement in symptoms so as ready for discharge  Short Term Goals:  Compliance with prescribed medications will improve   Medication Management: Evaluate patient's response, side effects, and tolerance of medication regimen.  Therapeutic Interventions: 1 to 1 sessions, Unit Group sessions and Medication administration.  Evaluation of Outcomes: Progressing   RN Treatment Plan for Primary Diagnosis: Schizoaffective disorder, bipolar type ([place]) Long Term Goal(s): Knowledge of disease and therapeutic regimen to maintain health will improve  Short Term Goals: Ability to identify and develop effective coping behaviors will improve and Compliance with prescribed medications will improve  Medication Management: RN will administer medications as ordered by provider, will assess and evaluate patient's response and provide education to patient for prescribed medication. RN will report any adverse and/or side effects to prescribing provider.  Therapeutic Interventions: 1 on 1 counseling sessions, Psychoeducation, Medication administration, Evaluate responses to treatment, Monitor vital signs and CBGs as ordered, Perform/monitor CIWA, COWS, AIMS and Fall Risk screenings as ordered, Perform wound care treatments as ordered.  Evaluation of Outcomes: Progressing    Recreational Therapy Treatment Plan for Primary Diagnosis: Schizoaffective disorder, bipolar type ([place]) Long Term Goal(s): LTG- Patient will participate in recreation therapy tx in at least 2 group sessions without prompting from LRT.  Short Term Goals: Patient will spontaneously contribute to discussions during at least 2 recreational therapy sessions without prompting or encouragement by conclusion of recreation therapy tx.  Treatment Modalities: Group and Pet Therapy  Therapeutic Interventions: Psychoeducation  Evaluation of Outcomes: Progressing   LCSW Treatment Plan for Primary Diagnosis: Schizoaffective disorder, bipolar type ([place]) Long Term Goal(s): Safe transition to appropriate next level of care at discharge, Engage patient in therapeutic group addressing interpersonal concerns.  Short Term Goals: Engage patient in aftercare planning with referrals and resources  Therapeutic Interventions: Assess for all discharge needs, 1 to 1 time with Social worker, Explore available resources and support systems, Assess for adequacy in community support network, Educate family and significant other(s) on suicide prevention, Complete Psychosocial Assessment, Interpersonal group therapy.  Evaluation of Outcomes: Met   Progress in Treatment: Attending groups: Yes Participating in groups: Yes Taking medication as prescribed: Yes Toleration medication: Yes, no side effects reported at this time Family/Significant other contact made: No Patient understands diagnosis: No  Limited insight Discussing patient identified problems/goals with staff: Yes Medical problems stabilized or resolved: Yes Denies suicidal/homicidal ideation: Yes Issues/concerns per patient self-inventory: None Other: N/A  New problem(s) identified: None identified at this time.   New Short Term/Long Term Goal(s): None identified at this time.   Discharge Plan or Barriers: return home, follow up outpt  Reason for  Continuation of Hospitalization: Anxiety Delusions  Medication stabilization   Estimated Length of Stay: 3-5 days  Attendees: Patient: 09/27/2016  5:00 PM  Physician: Ursula Alert, MD 09/27/2016  5:00 PM  Nursing: Hoy Register, RN 09/27/2016  5:00 PM  RN Care Manager: Lars Pinks, RN 09/27/2016  5:00 PM  Social Worker: Ripley Fraise 09/27/2016  5:00 PM  Recreational Therapist: Lilyanne Mcquown  09/27/2016  5:00 PM  Other: Norberto Sorenson 09/27/2016  5:00 PM  Other:  09/27/2016  5:00 PM    Scribe for Treatment Team:  Roque Lias 09/27/2016 5:00 PM

## 2016-09-27 NOTE — B[facility] Group Notes (Signed)
B[facility] Group Notes:  (Counselor/Nursing/MHT/Case Management/Adjunct)  09/27/2016 1:15PM  Type of Therapy:  Group Therapy  Participation Level:  Active  Participation Quality:  Appropriate  Affect:  Flat  Cognitive:  Oriented  Insight:  Improving  Engagement in Group:  Limited  Engagement in Therapy:  Limited  Modes of Intervention:  Discussion, Exploration and Socialization  Summary of Progress/Problems: The topic for group was balance in life.  Pt participated in the discussion about when their life was in balance and out of balance and how this feels.  Pt discussed ways to get back in balance and short term goals they can work on to get where they want to be. Stayed the entire time, engaged throughout.  Stated she is balanced "because I am feeling calm and not overly anxious.  It's manageable."  Stated she finds balance through "getting outside of my head, which is where I am most of the time" and going dancing or another kind of physical outlet that switches her mind off.  Pushing herself to interact socially helps as well.   Daryel Geraldorth, Kataleia Quaranta B 09/27/2016 2:58 PM
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
## 2016-09-27 NOTE — [facility] Group Notes (Signed)
Adult Psychoeducational Group Note  Date:  09/27/2016 Time:  0930-1000  Group Topic/Focus: Goals, check in, and discuss healthy leisure.  Each patient was asked to share their goal and one healthy leisure activity, then the nurse discussed the importance of healthy leisure.  Participation Level:  Active  Participation Quality:  Appropriate  Affect:  Animated  Insight: Lacking  Engagement in Group:  Monopolizing, off topic, flight of ideas.  Modes of Intervention:  Discussion, Education and Support  Additional Comments:  Patient attended for duration of group.  When asked why she was here she gave a lengthy explanation about how she can heal herself, was hyperverbal and tangential, and would need to be asked a question several times before answering it appropriately.  At one point during her long stream of speaking she said "the spirit is moving me to say, it just came to me..." then continued speaking.  Was polite with other peers and pleasant.
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
## 2016-09-27 NOTE — Progress Notes (Signed)
Nursing Note 09/27/2016 [id number]  Data Reports sleeping good without sleep medicine.  Reports ongoing anxiety  Rates depression 2/10, hopelessness 0/10, and anxiety 7/10. Affect animated.  Denies HI, SI, AVH.  Refused AM medicines ordered, requested to speak to MD first.  Patient very resistant to taking medicines after speaking with MD; however, after a lot of conversation and support she did take her first dose Abilify late, and reluctantly took her first dose of Lamictal.  Hesitancy related to her belief that she can heal herself.  Speech disorganized, rapid, and delusional.  At one point said "What you just said makes a lot of sense, I can see a connection between this life and my previous ones.  I need to heal myself, I don't like medicine."  Reported feeling very angry after taking her medicine, was seen crying in her bed after taking her lamictal.  She took it after it was explained that the medicine is only a piece of her recovery, that it is there to help with any chemical imbalances, and that she can continue her personal healing practices if she wishes.  Attending groups.  Speech bizarre at times, interacting with one of the peers stating "I can tell you are galactic, you are connected with this world's geography."  Action Spoke with patient 1:1, nurse offered support to patient throughout shift.  A lot of encouragement and 1:1 discussion and education about medicine given.  Praised for taking medicines, and encouraged to keep an open and ongoing discussion about medicines with MD.  Continues to be monitored on 15 minute checks for safety.  Response Remains safe on unit, taking medicines reluctantly.  Agrees to take medicine until she talks to attending MD in the morning.

## 2016-09-27 NOTE — Progress Notes (Signed)
Recreation Therapy Notes  Date: 09/27/16 Time: 1000 Location: 500 Hall Dayroom  Group Topic: Stress Management  Goal Area(s) Addresses:  Patient will verbalize importance of using healthy stress management.  Patient will identify positive emotions associated with healthy stress management.   Intervention: Calm App, Deep Breathing Script  Activity :  Deep Breathing, Body Scan Meditation.  LRT introduced the stress management techniques of deep breathing and meditation.  LRT read a script and played a recording to allow the patients to engage in the techniques.  Patients were to follow along to with the script and recording to engage in the activity.  Education:  Stress Management, Discharge Planning.   Education Outcome: Acknowledges edcuation/In group clarification offered/Needs additional education  Clinical Observations/Feedback:  Pt did not attend group.   Caroll RancherMarjette Jacquetta Polhamus, LRT/CTRS     Caroll RancherLindsay, Daveah Varone A 09/27/2016 12:58 PM

## 2016-09-27 NOTE — H&P (Signed)
Psychiatric Admission Assessment Adult  Patient Identification: Anita Ball MRN:  [id number] Date of Evaluation:  09/27/2016 Chief Complaint:  UNSPECIFIED SCHIZOPHRENIA SPECTRUM AND OTHER PSYCHOTIC DISORDERS Principal Diagnosis: Schizoaffective disorder, bipolar type ([place]) Diagnosis:   Patient Active Problem List   Diagnosis Date Noted  . Schizoaffective disorder, bipolar type ([place]) [F25.0] 09/27/2016  . Chest pain [R07.9] 07/19/2014  . Systolic murmur [H15.0] 56/97/9480   History of Present Illness: Anita Ball, 40 yo female, came was admitted to [facility] with IVC petition from parents.  They reported that patient was "spiritually channeling energy" and was chanting random sounds.  When she was seen in assessment, she presented in a calm demeanor but it is evident that she is tangential and had pressured speech.  She reports that her parents had her brought here against her will.  She reports that she is a "medium" and that she has a gift and a purpose to help others.  She reports that her parents just don't understand that she has a form of higher energy, that she has a hard time relating in this world because she goes in and out of different dimensions.  She states that she is from [place] and that she had three jobs (massage therapist, healer and medium) but the energy and dimension pulled her away from that.  She reports that she is hyper sensitive at the current time wherein she is hearing voices, thoughts and feelings all the time.  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  She states she was admitted to hospital when it was still [facility], also IVC, against her will because of her parents.  She denies mental health history and provider.  She reports that one of her main problems is anxiety.    Associated Signs/Symptoms: Depression Symptoms:  depressed  mood, anxiety, panic attacks, (Hypo) Manic Symptoms:  Hallucinations, Impulsivity, Irritable Mood, Labiality of Mood, Anxiety Symptoms:  Excessive Worry, Social Anxiety, Psychotic Symptoms:  Paranoia, PTSD Symptoms: NA Total Time spent with patient: 45 minutes  Past Psychiatric History: see HPI  Is the patient at risk to self? Yes.    Has the patient been a risk to self in the past 6 months? Yes.    Has the patient been a risk to self within the distant past? Yes.    Is the patient a risk to others? Yes.    Has the patient been a risk to others in the past 6 months? Yes.    Has the patient been a risk to others within the distant past? Yes.     Prior Inpatient Therapy:   Prior Outpatient Therapy:    Alcohol Screening: 1. How often do you have a drink containing alcohol?: Monthly or less 2. How many drinks containing alcohol do you have on a typical day when you are drinking?: 1 or 2 3. How often do you have six or more drinks on one occasion?: Never Preliminary Score: 0 9. Have you or someone else been injured as a result of your drinking?: No 10. Has a relative or friend or a doctor or another health worker been concerned about your drinking or suggested you cut down?: No Alcohol Use Disorder Identification Test Final Score (AUDIT): 1 Brief Intervention: AUDIT score less than 7 or less-screening does not suggest unhealthy drinking-brief intervention not indicated Substance Abuse History in the last 12 months:  Yes.   Consequences of Substance Abuse: NA Previous Psychotropic Medications: No, denied Psychological Evaluations: Yes  Past Medical History:  Past Medical History:  Diagnosis Date  . Anxiety   . Depression   . Heart murmur    History reviewed. No pertinent surgical history. Family History:  Family History  Problem Relation Age of Onset  . Depression Mother   . Alcoholism Father    Family Psychiatric  History: see HPI Tobacco Screening: Have you used any  form of tobacco in the last 30 days? (Cigarettes, Smokeless Tobacco, Cigars, and/or Pipes): Yes Tobacco use, Select all that apply: 5 or more cigarettes per day Are you interested in Tobacco Cessation Medications?: No, patient refused Counseled patient on smoking cessation including recognizing danger situations, developing coping skills and basic information about quitting provided: Refused/Declined practical counseling Social History:  History  Alcohol Use No     History  Drug Use No    Additional Social History: Marital status: Widowed Widowed, when?: 10 years ago-sudden death-we had separated a monthy before-he was an addict-died of pancreatitiis Are you sexually active?: No What is your sexual orientation?: straight Does patient have children?: Yes How many children?: 1 How is patient's relationship with their children?: 63 YO daughter-good-"I'm concerned because she is going through an abusive relationship"    Pain Medications: See MAR Prescriptions: See MAR Over the Counter: See MAR History of alcohol / drug use?: No history of alcohol / drug abuse                    Allergies:  No Known Allergies Lab Results:  Results for orders placed or performed during the hospital encounter of 09/25/16 (from the past 48 hour(s))  Comprehensive metabolic panel     Status: Abnormal   Collection Time: 09/25/16  8:38 PM  Result Value Ref Range   Sodium 136 135 - 145 mmol/L   Potassium 3.8 3.5 - 5.1 mmol/L   Chloride 106 101 - 111 mmol/L   CO2 20 (L) 22 - 32 mmol/L   Glucose, Bld 97 65 - 99 mg/dL   BUN 17 6 - 20 mg/dL   Creatinine, Ser 0.86 0.44 - 1.00 mg/dL   Calcium 9.3 8.9 - 10.3 mg/dL   Total Protein 7.1 6.5 - 8.1 g/dL   Albumin 4.3 3.5 - 5.0 g/dL   AST 34 15 - 41 U/L   ALT 27 14 - 54 U/L   Alkaline Phosphatase 38 38 - 126 U/L   Total Bilirubin 0.9 0.3 - 1.2 mg/dL   GFR calc non Af Amer >60 >60 mL/min   GFR calc Af Amer >60 >60 mL/min    Comment: (NOTE) The eGFR has  been calculated using the CKD EPI equation. This calculation has not been validated in all clinical situations. eGFR's persistently <60 mL/min signify possible Chronic Kidney Disease.    Anion gap 10 5 - 15  Ethanol     Status: None   Collection Time: 09/25/16  8:38 PM  Result Value Ref Range   Alcohol, Ethyl (B) <5 <5 mg/dL    Comment:        LOWEST DETECTABLE LIMIT FOR SERUM ALCOHOL IS 5 mg/dL FOR MEDICAL PURPOSES ONLY   Salicylate level     Status: None   Collection Time: 09/25/16  8:38 PM  Result Value Ref Range   Salicylate Lvl <6.1 2.8 - 30.0 mg/dL  Acetaminophen level     Status: Abnormal   Collection Time: 09/25/16  8:38  PM  Result Value Ref Range   Acetaminophen (Tylenol), Serum <10 (L) 10 - 30 ug/mL    Comment:        THERAPEUTIC CONCENTRATIONS VARY SIGNIFICANTLY. A RANGE OF 10-30 ug/mL MAY BE AN EFFECTIVE CONCENTRATION FOR MANY PATIENTS. HOWEVER, SOME ARE BEST TREATED AT CONCENTRATIONS OUTSIDE THIS RANGE. ACETAMINOPHEN CONCENTRATIONS >150 ug/mL AT 4 HOURS AFTER INGESTION AND >50 ug/mL AT 12 HOURS AFTER INGESTION ARE OFTEN ASSOCIATED WITH TOXIC REACTIONS.   cbc     Status: Abnormal   Collection Time: 09/25/16  8:38 PM  Result Value Ref Range   WBC 12.1 (H) 4.0 - 10.5 K/uL   RBC 4.62 3.87 - 5.11 MIL/uL   Hemoglobin 14.7 12.0 - 15.0 g/dL   HCT 40.8 36.0 - 46.0 %   MCV 88.3 78.0 - 100.0 fL   MCH 31.8 26.0 - 34.0 pg   MCHC 36.0 30.0 - 36.0 g/dL   RDW 12.8 11.5 - 15.5 %   Platelets 243 150 - 400 K/uL  Rapid urine drug screen (hospital performed)     Status: None   Collection Time: 09/26/16 11:00 AM  Result Value Ref Range   Opiates NONE DETECTED NONE DETECTED   Cocaine NONE DETECTED NONE DETECTED   Benzodiazepines NONE DETECTED NONE DETECTED   Amphetamines NONE DETECTED NONE DETECTED   Tetrahydrocannabinol NONE DETECTED NONE DETECTED   Barbiturates NONE DETECTED NONE DETECTED    Comment:        DRUG SCREEN FOR MEDICAL PURPOSES ONLY.  IF CONFIRMATION  IS NEEDED FOR ANY PURPOSE, NOTIFY LAB WITHIN 5 DAYS.        LOWEST DETECTABLE LIMITS FOR URINE DRUG SCREEN Drug Class       Cutoff (ng/mL) Amphetamine      1000 Barbiturate      200 Benzodiazepine   623 Tricyclics       762 Opiates          300 Cocaine          300 THC              50   Urinalysis, Routine w reflex microscopic (not at [facility])     Status: Abnormal   Collection Time: 09/26/16 11:00 AM  Result Value Ref Range   Color, Urine YELLOW YELLOW   APPearance CLEAR CLEAR   Specific Gravity, Urine 1.014 1.005 - 1.030   pH 5.5 5.0 - 8.0   Glucose, UA NEGATIVE NEGATIVE mg/dL   Hgb urine dipstick SMALL (A) NEGATIVE   Bilirubin Urine NEGATIVE NEGATIVE   Ketones, ur >80 (A) NEGATIVE mg/dL   Protein, ur NEGATIVE NEGATIVE mg/dL   Nitrite NEGATIVE NEGATIVE   Leukocytes, UA NEGATIVE NEGATIVE  Urine microscopic-add on     Status: Abnormal   Collection Time: 09/26/16 11:00 AM  Result Value Ref Range   Squamous Epithelial / LPF 0-5 (A) NONE SEEN   WBC, UA 0-5 0 - 5 WBC/hpf   RBC / HPF 0-5 0 - 5 RBC/hpf   Bacteria, UA RARE (A) NONE SEEN  Pregnancy, urine     Status: None   Collection Time: 09/26/16 11:05 AM  Result Value Ref Range   Preg Test, Ur NEGATIVE NEGATIVE    Comment:        THE SENSITIVITY OF THIS METHODOLOGY IS >20 mIU/mL.     Blood Alcohol level:  Lab Results  Component Value Date   ETH <5 83/15/1761    Metabolic Disorder Labs:  No results found for: HGBA1C, MPG No results  found for: PROLACTIN Lab Results  Component Value Date   CHOL 161 08/25/2014   TRIG 133.0 08/25/2014   HDL 50.50 08/25/2014   CHOLHDL 3 08/25/2014   VLDL 26.6 08/25/2014   LDLCALC 84 08/25/2014    Current Medications: Current Facility-Administered Medications  Medication Dose Route Frequency Provider Last Rate Last Dose  . acetaminophen (TYLENOL) tablet 650 mg  650 mg Oral Q6H PRN Patrecia Pour, NP      . alum & mag hydroxide-simeth (MAALOX/MYLANTA) 200-200-20 MG/5ML suspension  30 mL  30 mL Oral Q4H PRN Patrecia Pour, NP      . amoxicillin (AMOXIL) capsule 500 mg  500 mg Oral TID WC & HS Patrecia Pour, NP   500 mg at 09/27/16 0824  . ARIPiprazole (ABILIFY) tablet 5 mg  5 mg Oral Daily Saramma Eappen, MD      . hydrOXYzine (ATARAX/VISTARIL) tablet 25 mg  25 mg Oral Q6H PRN Ursula Alert, MD      . lamoTRIgine (LAMICTAL) tablet 25 mg  25 mg Oral QPM Saramma Eappen, MD      . magnesium hydroxide (MILK OF MAGNESIA) suspension 30 mL  30 mL Oral Daily PRN Patrecia Pour, NP      . OLANZapine (ZYPREXA) tablet 5 mg  5 mg Oral TID PRN Ursula Alert, MD       Or  . OLANZapine (ZYPREXA) injection 5 mg  5 mg Intramuscular TID PRN Ursula Alert, MD      . traZODone (DESYREL) tablet 50 mg  50 mg Oral QHS PRN Ursula Alert, MD       PTA Medications: Facility-Administered Medications Prior to Admission  Medication Dose Route Frequency Provider Last Rate Last Dose  . gi cocktail (Maalox,Lidocaine,Donnatal)  30 mL Oral Once Darreld Mclean, MD       Prescriptions Prior to Admission  Medication Sig Dispense Refill Last Dose  . amoxicillin (AMOXIL) 500 MG capsule Take 500 mg by mouth every 6 (six) hours.  0 09/24/2016    Musculoskeletal: Strength & Muscle Tone: within normal limits Gait & Station: normal Patient leans: N/A  Psychiatric Specialty Exam: Physical Exam  Nursing note and vitals reviewed. Psychiatric: Her mood appears anxious. Her affect is labile. Her speech is tangential.    Review of Systems  Constitutional: Negative.   Eyes: Negative.   Respiratory: Negative.   Cardiovascular: Negative.   Gastrointestinal: Negative.   Genitourinary: Negative.   Musculoskeletal: Negative.   Skin: Negative.   Neurological: Negative.   Endo/Heme/Allergies: Negative.     Blood pressure 115/67, pulse (!) 104, temperature 98.5 F (36.9 C), resp. rate 16, height '5\' 3"'  (1.6 m), weight 67.6 kg (149 lb), SpO2 100 %.Body mass index is 26.39 kg/m.  General Appearance:  Neat  Eye Contact:  Fair  Speech:  Pressured  Volume:  Normal  Mood:  Anxious and Euphoric  Affect:  Full Range  Thought Process:  Irrelevant  Orientation:  Full (Time, Place, and Person)  Thought Content:  Delusions, Rumination and Tangential  Suicidal Thoughts:  No  Homicidal Thoughts:  No  Memory:  Immediate;   Fair Recent;   Fair Remote;   Fair  Judgement:  Impaired  Insight:  Shallow  Psychomotor Activity:  Restlessness  Concentration:  Concentration: Fair and Attention Span: Fair  Recall:  [employer] of Knowledge:  Fair  Language:  Fair  Akathisia:  No  Handed:  Right  AIMS (if indicated):     Assets:  Communication Skills  Desire for Improvement Physical Health Resilience Social Support  ADL's:  Intact  Cognition:  WNL  Sleep:  Number of Hours: 6.75    Treatment Plan Summary: Admit for crisis management and mood stabilization. Medication management to re-stabilize current mood symptoms Group counseling sessions for coping skills Medical consults as needed Review and reinstate any pertinent home medications for other health problems   Observation Level/Precautions:  15 minute checks  Laboratory:  per ED  Psychotherapy:  group  Medications:  Abilify 5 mg daily mood stabilization/psychosis, Lamictal 25 mg daily mood stabilization, Zyprexa PRN agitation, Trazodone 50 mg as needed for sleep  Consultations:  As needed  Discharge Concerns:  Safety  Estimated LOS:  2-7 days  Other:     Physician Treatment Plan for Primary Diagnosis: Schizoaffective disorder, bipolar type ([place]) Long Term Goal(s): Improvement in symptoms so as ready for discharge  Short Term Goals: Ability to identify changes in lifestyle to reduce recurrence of condition will improve, Ability to verbalize feelings will improve, Ability to disclose and discuss suicidal ideas, Ability to demonstrate self-control will improve, Ability to identify and develop effective coping behaviors will improve,  Ability to maintain clinical measurements within normal limits will improve, Compliance with prescribed medications will improve and Ability to identify triggers associated with substance abuse/mental health issues will improve  Physician Treatment Plan for Secondary Diagnosis: Principal Problem:   Schizoaffective disorder, bipolar type ([place])  Long Term Goal(s): Improvement in symptoms so as ready for discharge  Short Term Goals: Ability to identify changes in lifestyle to reduce recurrence of condition will improve, Ability to verbalize feelings will improve, Ability to disclose and discuss suicidal ideas, Ability to demonstrate self-control will improve, Ability to identify and develop effective coping behaviors will improve, Ability to maintain clinical measurements within normal limits will improve, Compliance with prescribed medications will improve and Ability to identify triggers associated with substance abuse/mental health issues will improve  I certify that inpatient services furnished can reasonably be expected to improve the patient's condition.    Janett Labella, NP [facility] 11/30/201712:58 PM

## 2016-09-28 LAB — LIPID PANEL
Cholesterol: 141 mg/dL (ref 0–200)
HDL: 58 mg/dL (ref >40)
LDL CALC: 65 mg/dL (ref 0–99)
TRIGLYCERIDES: 88 mg/dL (ref <150)
Total CHOL/HDL Ratio: 2.4 RATIO
VLDL: 18 mg/dL (ref 0–40)

## 2016-09-28 LAB — TSH: TSH: 0.508 u[IU]/mL (ref 0.350–4.500)

## 2016-09-28 MED ORDER — OLANZAPINE 10 MG IM SOLR: 5.0000 mg | Freq: Two times a day (BID) | INTRAMUSCULAR | Status: DC | PRN

## 2016-09-28 MED ORDER — OLANZAPINE 5 MG PO TBDP
5.0000 mg | ORAL_TABLET | Freq: Every day | ORAL | Status: DC
  Administered 2016-09-28 – 2016-10-01 (×4): 5 mg via ORAL
  Filled 2016-09-28 (×6): qty 1

## 2016-09-28 MED ORDER — NICOTINE POLACRILEX 2 MG MT GUM
2.0000 mg | CHEWING_GUM | OROMUCOSAL | Status: DC | PRN
  Administered 2016-09-28 – 2016-10-02 (×2): 2 mg via ORAL

## 2016-09-28 MED ORDER — OLANZAPINE 5 MG PO TABS
5.0000 mg | ORAL_TABLET | Freq: Two times a day (BID) | ORAL | Status: DC | PRN
  Administered 2016-09-30: 5 mg via ORAL
  Filled 2016-09-28: qty 2

## 2016-09-28 MED ORDER — OLANZAPINE 5 MG PO TBDP
5.0000 mg | ORAL_TABLET | Freq: Every day | ORAL | Status: DC
  Administered 2016-09-28 – 2016-10-02 (×5): 5 mg via ORAL
  Filled 2016-09-28 (×7): qty 1

## 2016-09-28 MED ORDER — TRAZODONE HCL 150 MG PO TABS
75.0000 mg | ORAL_TABLET | Freq: Every day | ORAL | Status: DC
  Administered 2016-09-28 – 2016-10-02 (×5): 75 mg via ORAL
  Filled 2016-09-28 (×5): qty 0.5
  Filled 2016-09-28: qty 1
  Filled 2016-09-28 (×2): qty 0.5

## 2016-09-28 NOTE — Progress Notes (Signed)
Adult Psychoeducational Group Note  Date:  09/28/2016 Time:  1:43 AM  Group Topic/Focus:  Wrap-Up Group:   The focus of this group is to help patients review their daily goal of treatment and discuss progress on daily workbooks.   Participation Level:  Active  Participation Quality:  Appropriate  Affect:  Appropriate  Cognitive:  Alert  Insight: Appropriate  Engagement in Group:  Engaged  Modes of Intervention:  Discussion  Additional Comments:  Patient states her day was fine. Patient's goal for today is to forgive.  Vergie Zahm L Roquel Burgin 09/28/2016, 1:43 AM

## 2016-09-28 NOTE — Progress Notes (Signed)
Adult Psychoeducational Group Note  Date:  09/28/2016 Time:  1:47 AM  Group Topic/Focus:  Wrap-Up Group:   The focus of this group is to help patients review their daily goal of treatment and discuss progress on daily workbooks.   Participation Level:  Active  Participation Quality:  Appropriate  Affect:  Appropriate  Cognitive:  Alert  Insight: Appropriate  Engagement in Group:  Engaged  Modes of Intervention:  Discussion  Additional Comments:  Patient states, "my day was fine". Patient's goal for today was to forgive.  Meriel Kelliher L Hilda Wexler 09/28/2016, 1:47 AM

## 2016-09-28 NOTE — Progress Notes (Signed)
Pt reports her day has been "ok".  She says that she has been dozing off and on during the day.  She denies SI/HI/AVH.  She still endorses her gift of "channeling spirits" and says that they are positive influences.  She says that if she feels negative energy, she will "purge It" from her body by vomiting.  Staff has not noticed any vomiting from patient.  Pt attended group, but went back to her room shortly after group was over.  Her interaction with peers is minimal, but there are only a couple of other females on the hall and her room mate speaks primarily spanish.  Pt is pleasant in conversation.  She takes her medication reluctantly, because she does not feel she needs it.  Support and encouragement offered.  Discharge plans are in process.  Pt plans to return home at discharge.  Safety maintained with q15 minute checks.

## 2016-09-28 NOTE — [facility] Group Notes (Signed)
[facility] LCSW Group Therapy  09/28/2016  1:05 PM  Type of Therapy:  Group therapy  Participation Level:  Active  Participation Quality:  Attentive  Affect:  Flat  Cognitive:  Oriented  Insight:  Limited  Engagement in Therapy:  Limited  Modes of Intervention:  Discussion, Socialization  Summary of Progress/Problems:  Chaplain was here to lead a group on themes of hope and courage. "Courage is keeping moving forward to reach the goal no matter what.  It's getting through the obstacle-even with fear present."  Talked about "dying and resurrecting."  Complained several times about how the medication is making her feel.  Daryel Geraldorth, Anita Ball B 09/28/2016 1:14 PM

## 2016-09-28 NOTE — Progress Notes (Signed)
Pt has been in her room most of the evening.  Writer went to speak with pt before group started.  She still presents with tangential speech and delusions of "channeling spirits" of other people.  She says she tries to stay away from negative people and work to heal herself.  Writer explained that that only med she has ordered for this evening is the antibiotic for her tooth.  Pt voiced understanding and said she would inform staff if she needed a sleep aid.  Pt has been pleasant and cooperative staff.  Pt voices no needs or concerns at this time.  She denies SI/HI/AVH.  She denies having any pain at this time.  Support and encouragement offered.  Discharge plans are in process.  Safety maintained with q15 minute checks.

## 2016-09-28 NOTE — [facility] Suicide Risk Assessment (Signed)
[facility] INPATIENT:  Family/Significant Other Suicide Prevention Education  Suicide Prevention Education:  Education Completed; No one has been identified by the patient as the family member/significant other with whom the patient will be residing, and identified as the person(s) who will aid the patient in the event of a mental health crisis (suicidal ideations/suicide attempt).  With written consent from the patient, the family member/significant other has been provided the following suicide prevention education, prior to the and/or following the discharge of the patient.  The suicide prevention education provided includes the following:  Suicide risk factors  Suicide prevention and interventions  National Suicide Hotline telephone number  [facility] telephone [facility]  [facility] Assistance 911  [facility] and/or [facility]idential [facility] Crisis [facility] telephone number  Request made of family/significant other to:  Remove weapons (e.g., guns, rifles, knives), all items previously/currently identified as safety concern.    Remove drugs/medications (over-the-counter, prescriptions, illicit drugs), all items previously/currently identified as a safety concern.  The family member/significant other verbalizes understanding of the suicide prevention education information provided.  The family member/significant other agrees to remove the items of safety concern listed above. The patient did not endorse SI at the time of admission, nor did the patient c/o SI during the stay here.  SPE not required. However, I talked to father, Anita Ball, [phone], and we went over a crises plan and treatment team recommendations. Ida RogueRodney B Anicka Stuckert 09/28/2016, 8:45 AM

## 2016-09-28 NOTE — Progress Notes (Signed)
Nursing Note 09/28/2016 [id number]  Data Reports sleeping poor without PRN sleep med "I was tossing and turning last night."  Rates depression 0/10, hopelessness 0/10, and anxiety 10-6/10 (after vistaril). Affect animated mood "anxious."  Denies HI, SI, AVH.  Patient still ambivalent about medicines; however, is taking them as ordered. Still talking about healing herself and channeling spirits.  Medicines adjusted this morning by psychiatrist, this afternoon and evening patient affect appeared blunted, patient reported feeling tired from the medicine.  Action Spoke with patient 1:1, nurse offered support to patient throughout shift.  Encouraged patient to continue to take medicines and report to MD tomorrow how she is responding to them.  Continues to be monitored on 15 minute checks for safety.  Response Pleasant, still reluctant to take medicines, but willing to continue taking them for now. Remains safe on unit.
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
## 2016-09-28 NOTE — Progress Notes (Signed)
Recreation Therapy Notes  Date: 09/28/16 Time: 1000 Location: 500 Hall Dayroom  Group Topic: Communication, Team Building, Problem Solving  Goal Area(s) Addresses:  Patient will effectively work with peer towards shared goal.  Patient will identify skill used to make activity successful.  Patient will identify how skills used during activity can be used to reach post d/c goals.   Intervention: STEM Activity   Activity: [place] [employer]. Patients were provided the following materials: 5 drinking straws, 5 rubber bands, 5 paper clips, 2 index cards, 2 drinking cups, and 2 toilet paper rolls. Using the provided materials patients were asked to build a launching mechanisms to launch a ping pong ball approximately 12 feet. Patients were divided into teams of 3-5.   Education: Pharmacist, communityocial Skills, Building control surveyorDischarge Planning.   Education Outcome: Acknowledges education/In group clarification offered/Needs additional education.   Clinical Observations/Feedback: Pt did not attend group.    Caroll RancherMarjette Emojean Gertz, LRT/CTRS      Caroll RancherLindsay, Elia Keenum A 09/28/2016 12:54 PM

## 2016-09-28 NOTE — Progress Notes (Signed)
Adult Psychoeducational Group Note  Date:  09/28/2016 Time:  8:33 PM  Group Topic/Focus:  Wrap-Up Group:   The focus of this group is to help patients review their daily goal of treatment and discuss progress on daily workbooks.   Participation Level:  Active  Participation Quality:  Appropriate  Affect:  Appropriate  Cognitive:  Appropriate  Insight: Appropriate  Engagement in Group:  Engaged  Modes of Intervention:  Discussion  Additional Comments:  The patient expressed that she attended group.The patient also said that rates today a 8. Octavio Mannshigpen, Judyann Casasola Lee 09/28/2016, 8:33 PM

## 2016-09-29 DIAGNOSIS — Z818 Family history of other mental and behavioral disorders: Secondary | ICD-10-CM

## 2016-09-29 DIAGNOSIS — F17210 Nicotine dependence, cigarettes, uncomplicated: Secondary | ICD-10-CM

## 2016-09-29 DIAGNOSIS — Z79899 Other long term (current) drug therapy: Secondary | ICD-10-CM

## 2016-09-29 DIAGNOSIS — Z811 Family history of alcohol abuse and dependence: Secondary | ICD-10-CM

## 2016-09-29 DIAGNOSIS — F250 Schizoaffective disorder, bipolar type: Principal | ICD-10-CM

## 2016-09-29 LAB — HEMOGLOBIN A1C
HEMOGLOBIN A1C: 5.2 % (ref 4.8–5.6)
MEAN PLASMA GLUCOSE: 103 mg/dL

## 2016-09-29 LAB — PROLACTIN: PROLACTIN: 20.4 ng/mL (ref 4.8–23.3)
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
## 2016-09-29 NOTE — Progress Notes (Signed)
Patient ID: Anita HarmanKatherine L Ball, female   DOB: 10/29/1976, 40 y.o.   MRN: [id number]   D: Pt has been very flat and the unit today, pt reported that the medication was making her feel funny. Pt attempted to refuse medication several times, but after encouragement she would take. Pt reported that she continues to channel spirits, and does not feel medications are necessary. Pt reported that her depression was a 0, her hopelessness was a 0, and her anxiety was a 7. Pt reported that her goal was to call family and get things taken care of.  Pt reported being negative SI/HI, no AH/VH noted. A: 15 min checks continued for patient safety. R: Pt safety maintained.

## 2016-09-29 NOTE — Progress Notes (Signed)
D: Pt denies SI/HI/AVH. Pt is pleasant and cooperative. Pt thinks the medications are too much. Pt presents paranoid and delusional at times. Pt tried to speak on people not understanding or listening to her but would not elaborate with Clinical research associatewriter.   A: Pt was offered support and encouragement. Pt was given scheduled medications. Pt was encourage to attend groups. Q 15 minute checks were done for safety.   R:Pt attends groups Pt receptive to treatment and safety maintained on unit.

## 2016-09-30 MED ORDER — LAMOTRIGINE 25 MG PO TABS
50.0000 mg | ORAL_TABLET | Freq: Every evening | ORAL | Status: DC
  Administered 2016-09-30 – 2016-10-02 (×3): 50 mg via ORAL
  Filled 2016-09-30 (×6): qty 2

## 2016-09-30 MED ORDER — HYDROXYZINE HCL 50 MG PO TABS
50.0000 mg | ORAL_TABLET | Freq: Four times a day (QID) | ORAL | Status: DC | PRN
  Administered 2016-09-30 – 2016-10-03 (×5): 50 mg via ORAL
  Filled 2016-09-30 (×5): qty 1

## 2016-09-30 MED ORDER — GABAPENTIN 100 MG PO CAPS
200.0000 mg | ORAL_CAPSULE | Freq: Three times a day (TID) | ORAL | Status: DC
  Administered 2016-09-30 – 2016-10-03 (×12): 200 mg via ORAL
  Filled 2016-09-30 (×22): qty 2

## 2016-09-30 NOTE — [facility] Group Notes (Signed)
[facility] Group Notes:  (Clinical Social Work)  09/30/2016  11:00AM-12:00PM  Summary of Progress/Problems:  The main focus of today's process group was to listen to a variety of genres of music and to identify that different types of music provoke different responses.  The patient then was able to identify personally what was soothing for them, as well as energizing, as well as how patient can personally use this knowledge in sleep habits, with depression, and with other symptoms.  The patient expressed at the beginning of group the overall feeling of anxiety "anywhere from a 7 to a 10."  At first, she was wrapped up in a blanket and had her head covered, was reluctant to come out.  She danced to a number of songs, smiled broadly a great deal.  She was called out to see a practitioner before the end of group.  Type of Therapy:  Music Therapy   Participation Level:  Active  Participation Quality:  Attentive and Sharing  Affect:  Blunted  Cognitive:  Disorganized  Insight:  Improving  Engagement in Therapy:  Engaged  Modes of Intervention:   Activity, Exploration  Ambrose MantleMareida Grossman-Orr, LCSW 09/30/2016
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
## 2016-09-30 NOTE — Progress Notes (Signed)
D: Pt denies SI/HI/AVH. Pt is pleasant and cooperative. Pt flight of ideas, tangential, and anxious. Pt religiously preoccupied at times. Pt stated she was feeling "better". Pt presents with pressured speech at times and is very circumstantial about her coming here and things going on in her life, blaming other people.   A: Pt was offered support and encouragement. Pt was given scheduled medications. Pt was encourage to attend groups. Q 15 minute checks were done for safety.   R:Pt attends groups and interacts well with peers and staff. Pt is taking medication. Pt receptive to treatment and safety maintained on unit.

## 2016-09-30 NOTE — Progress Notes (Signed)
[facility] Group Notes:  (Nursing/MHT/Case Management/Adjunct)  Date:  09/30/2016  Time:  8:39 PM  Type of Therapy:  Psychoeducational Skills  Participation Level:  Active  Participation Quality:  Appropriate  Affect:  Appropriate  Cognitive:  Appropriate  Insight:  Good  Engagement in Group:  Engaged  Modes of Intervention:  Education  Summary of Progress/Problems: The patient verbalized in group this evening that she was concerned about her health and that she felt "drugged" from the medication. She went on to explain that she felt very drowsy as a result of the medication and that she normally does not feel that way. She also stated that she is concerned about her "nerves" and about her "restlessness". Finally, the patient verbalized that she spent time today listening to her parents and that she is trying to listen to their advice and accept their support. As for the theme of the day, she does not have a support system at this time.   Hazle CocaGOODMAN, Tahmir Kleckner S 09/30/2016, 8:39 PM

## 2016-09-30 NOTE — Progress Notes (Signed)
Patient ID: Anita HarmanKatherine L Ball, female   DOB: 1975/11/08, 40 y.o.   MRN: [id number]    D: Pt has been very flat and the unit today, pt reported that the medication was making her feel funny. Pt reported that her medication was also not helping and that she just wanted the insurance to pay for Saphris. Pt reported that she was her best when she was on Saphris. Pt reported that her depression was a 0, her hopelessness was a 0, and her anxiety was a 10. Pt reported that her goal was to find balance.  Pt reported being negative SI/HI, no AH/VH noted. A: 15 min checks continued for patient safety. R: Pt safety maintained.

## 2016-09-30 NOTE — Plan of Care (Signed)
Problem: Coping: Goal: Ability to identify and develop effective coping behavior will improve Outcome: Progressing Pt stated she was feeling anxiuos today, but stated she did good calming herself down earlier

## 2016-10-01 NOTE — Progress Notes (Signed)
Adult Psychoeducational Group Note  Date:  10/01/2016 Time:  8:52 PM  Group Topic/Focus:  Wrap-Up Group:   The focus of this group is to help patients review their daily goal of treatment and discuss progress on daily workbooks.   Participation Level:  Active  Participation Quality:  Drowsy  Affect:  Labile  Cognitive:  Appropriate  Insight: Lacking  Engagement in Group:  Engaged  Modes of Intervention:  Socialization and Support  Additional Comments:  Patient attended and participated in group tonight. She reports having a good day. She felt emotionally stable for the most part, however, she does not feel physically stable. Today she talk to her mom. She advised that it will take time to forgive. She knows her family loves her. She is forcing herself to stay awake during the daytime so she could sleep at night. Today she talk with her doctor about her concerns regarding her lack of sleeping, but she doesn't think anyone is listening. The patient advised that she do get irritated sometimes.  Anita Ball, Anita Holten [facility] 10/01/2016, 8:52 PM
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
## 2016-10-01 NOTE — Plan of Care (Signed)
Problem: Safety: Goal: Periods of time without injury will increase Outcome: Progressing Pt safe on the unit at this time   

## 2016-10-01 NOTE — [facility] Group Notes (Signed)
[facility] LCSW Group Therapy  10/01/2016 1:15 pm  Type of Therapy: Process Group Therapy  Participation Level:  Active  Participation Quality:  Appropriate  Affect:  Flat  Cognitive:  Oriented  Insight:  Improving  Engagement in Group:  Limited  Engagement in Therapy:  Limited  Modes of Intervention:  Activity, Clarification, Education, Problem-solving and Support  Summary of Progress/Problems: Today's group addressed the issue of overcoming obstacles.  Patients were asked to identify their biggest obstacle post d/c that stands in the way of their on-going success, and then problem solve as to how to manage this.Stayed the entire time, engaged throughout.  Still talking about the "spiritual realm" and how her family has difficulty relating to that.  But also acknowledged she is trying to become more grounded "in the physical world" so that it is easier for her to gain support from family and others.  C/O meds, but laso acknowledges that she feels a lot less anxious and stressed.  Ida Rogueorth, Alayziah Tangeman B 10/01/2016   3:36 PM

## 2016-10-01 NOTE — Progress Notes (Signed)
Recreation Therapy Notes  Date: 10/01/16 Time: 1000 Location: [address] Dayroom  Group Topic: Coping Skills  Goal Area(s) Addresses:  Patients will be able to identify positive coping skills. Patients will be able to identify the benefits of coping skills. Patients will be able to identify how using positive coping skill can help them post d/c.  Behavioral Response: Engaged  Intervention: Financial traderWeb worksheet, pencils  Activity: OrthoptistWeb Design.  Patients were to identify the situations that have brought them to the hospital and place them inside the spider web.  Patients were to then identify coping skills that can help them deal with those situations.  Education: PharmacologistCoping Skills, Building control surveyorDischarge Planning.   Education Outcome: Acknowledges understanding/In group clarification offered/Needs additional education.   Clinical Observations/Feedback:  Pt stated coping skills "are a matter of surviving and how to deal with situations".  Pt identified some of issues as family, being the "odd one out", having no support, voice getting shut out, not at communication and fear of rejection.  The coping skills pt identified were teaching from a "whole place", work with the system, standing strong in who she is and walking in her truth.  Pt expressed that using her coping skills will "help me develop better communication"   Anita RancherMarjette Saksham Akkerman, LRT/CTRS       Anita RancherLindsay, [name] A 10/01/2016 12:03 PM

## 2016-10-01 NOTE — Progress Notes (Signed)
D: Pt A & O to place, time, and events. Pt presented with anxious mood and affect on initial approach. Pt's is till tangential in her speech, delusional and grandiose at intervals, related to her super natural powers. Observed with somatic complaints at the beginning of this shift related to medication compliance. Pt stated "I really don't want to take all these medications, It's making me so sick, I just want to go home, without all of these medications". Rated her depression, anxiety and hopelessness all 0/10 on self inventory sheet.  A: Scheduled medications administered as prescribed with  verbal education. Emotional support and availability provided to pt. Writer encouraged pt to speak with psychiatrist during assessment related to medications, voice concerns, attend groups and comply with medications as ordered. Q 15 minutes checks maintained without outburst or self harm gestures thus far. R: Pt receptive to care. Remains compliant with his medications with verbal encouragement and has been cooperative with unit routines. Denies concerns at present. Tolerates all PO intake well. Will continue to monitor pt for mood stabilization and safety.

## 2016-10-01 NOTE — Progress Notes (Signed)
D: Pt denies SI/HI/AVH. Pt is pleasant and cooperative. Pt stated she was emotionally good but physically bad. Pt complained of nerve pain ( was given Neurontin per order to help) . Pt stated the medications were helping her, even the Neurontin. Pt stated she really would like to get on the Sapharis due to how it made her feel.    A: Pt was offered support and encouragement. Pt was given scheduled medications. Pt was encourage to attend groups. Q 15 minute checks were done for safety.   R:Pt attends groups and interacts well with peers and staff. Pt is taking medication. Pt has no complaints.Pt receptive to treatment and safety maintained on unit.

## 2016-10-02 MED ORDER — ASENAPINE MALEATE 5 MG SL SUBL
10.0000 mg | SUBLINGUAL_TABLET | Freq: Every day | SUBLINGUAL | Status: DC
  Administered 2016-10-02: 10 mg via SUBLINGUAL
  Filled 2016-10-02 (×3): qty 2

## 2016-10-02 NOTE — Progress Notes (Signed)
Recreation Therapy Notes  Animal-Assisted Activity (AAA) Program Checklist/Progress Notes Patient Eligibility Criteria Checklist & Daily Group note for Rec Tx Intervention  Date: 12.05.2017 Time: 2:45pm [address]    AAA/T Program Assumption of Risk Form signed by Patient/ or Parent Legal Guardian Yes  Patient is free of allergies or sever asthma Yes  Patient reports no fear of animals Yes  Patient reports no history of cruelty to animals Yes  Patient understands his/her participation is voluntary Yes  Patient washes hands before animal contact Yes  Patient washes hands after animal contact Yes  Behavioral Response: Engaged, Appropriate    Education: Charity fundraiserHand Washing, Appropriate Animal Interaction   Education Outcome: Acknowledges education.   Clinical Observations/Feedback: Patient discussed with MD for appropriateness in pet therapy session. Both LRT and MD agree patient is appropriate for participation. Patient offered participation in session and signed necessary consent form without issue. Patient attended session for approximately 15 minutes. Patient interacted appropriately with peers and dog team during time in group.   Anita Ball, LRT/CTRS       Angelina Neece L 10/02/2016 3:08 PM

## 2016-10-02 NOTE — Progress Notes (Signed)
D: Anita Ball denied SI, HI, and AVH. She's complained of anxiety throughout the day, for which she received Vistaril with minimal relief. She's participated in unit activities and been engaged. She's been appropriate in conversation, although she remains steadfast in her belief that she has a connection with spirits that is helpful to her and others. She is frustrated by her diagnosis, but she seemed to recognize this a.m that herbal medications are not the answer to all ills. She was pleased that Saphris was restarted.  A: Meds given as ordered. Q15 safety checks maintained. Support/encouragement offered. R: Pt remains free from harm and continues with treatment. Will continue to monitor for needs/safety.

## 2016-10-02 NOTE — Progress Notes (Signed)
D: Pt denies SI/HI/AVh. Pt is pleasant and cooperative. Pt incoherent at times, but appropriate on the unit at this time. Pt stated she was happy she was back on the  Saphris. Pt stated she felt good and feel as if she can move forward.   A: Pt was offered support and encouragement. Pt was given scheduled medications. Pt was encourage to attend groups. Q 15 minute checks were done for safety.  R:Pt attends groups and interacts well with peers and staff. Pt is taking medication. Pt has no complaints.Pt receptive to treatment and safety maintained on unit.

## 2016-10-02 NOTE — Progress Notes (Signed)
Adult Psychoeducational Group Note  Date:  10/02/2016 Time:  8:36 PM  Group Topic/Focus:  Wrap-Up Group:   The focus of this group is to help patients review their daily goal of treatment and discuss progress on daily workbooks.   Participation Level:  Active  Participation Quality:  Appropriate  Affect:  Appropriate  Cognitive:  Appropriate  Insight: Appropriate  Engagement in Group:  Engaged  Modes of Intervention:  Discussion  Additional Comments:  The patient expressed that she attended groups.The patient also said that she rates today a 8 which is a good day. Octavio Mannshigpen, Kinzlie Harney Lee 10/02/2016, 8:36 PM
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
## 2016-10-02 NOTE — Progress Notes (Signed)
Recreation Therapy Notes  Date: 10/02/16 Time: 1000 Location: [address] Dayroom  Group Topic: Anger Management  Goal Area(s) Addresses:  Patient will identify triggers for anger.  Patient will identify physical reaction to anger.   Patient will identify benefit of using coping skills when angry.  Behavioral Response: Engaged  Intervention: Anger thermometer worksheet  Activity: Anger Thermometer.  Patients were given a worksheet with a thermometer to rank their experiences with anger "1" being the least and "10" being the most angry.  Patients were to give a description of the incident, how they reacted, how they felt and the consequences of their actions.    Education: Anger Management, Discharge Planning   Education Outcome: Acknowledges education/In group clarification offered/Needs additional education.   Clinical Observations/Feedback: Pt was tearful during group.  Pt stated "I was never angry until I came here". Pt stated she feels people don't listen to her and that her voice is being cut off.  Pt also stated she feels people are trying to shut down her gifts and that she is just a number here.  Pt expressed that she needs to use her tests to empower herself and she needs that empowerment to express her feelings and teach.  Caroll RancherMarjette Ysidra Sopher, LRT/CTRS       Caroll RancherLindsay, Khushbu Pippen A 10/02/2016 11:49 AM

## 2016-10-02 NOTE — [facility] Group Notes (Signed)
[facility] LCSW Group Therapy  10/02/2016 , 2:57 PM   Type of Therapy:  Group Therapy  Participation Level:  Active  Participation Quality:  Attentive  Affect:  Appropriate  Cognitive:  Alert  Insight:  Improving  Engagement in Therapy:  Engaged  Modes of Intervention:  Discussion, Exploration and Socialization  Summary of Progress/Problems: Today's group focused on the term Diagnosis.  Participants were asked to define the term, and then pronounce whether it is a negative, positive or neutral term. Stayed the entire time, engaged throughout.  More grounded, less tangential.  Identified words of encouragement and a less judgemental attitude-"more perceptual with the heart"-as things she has been working on since here. Daryel Geraldorth, Senay Sistrunk B 10/02/2016 , 2:57 PM

## 2016-10-02 NOTE — Tx Team (Signed)
Interdisciplinary Treatment and Diagnostic Plan Update  10/02/2016 Time of Session: 2:54 PM  Anita Ball MRN: [id number]  Principal Diagnosis: Schizoaffective disorder, bipolar type [facility])  Secondary Diagnoses: Principal Problem:   Schizoaffective disorder, bipolar type ([place])   Current Medications:  Current Facility-Administered Medications  Medication Dose Route Frequency Provider Last Rate Last Dose  . acetaminophen (TYLENOL) tablet 650 mg  650 mg Oral Q6H PRN Patrecia Pour, NP      . alum & mag hydroxide-simeth (MAALOX/MYLANTA) 200-200-20 MG/5ML suspension 30 mL  30 mL Oral Q4H PRN Patrecia Pour, NP      . amoxicillin (AMOXIL) capsule 500 mg  500 mg Oral TID WC & HS Patrecia Pour, NP   500 mg at 10/02/16 1218  . asenapine (SAPHRIS) sublingual tablet 10 mg  10 mg Sublingual QHS Saramma Eappen, MD      . gabapentin (NEURONTIN) capsule 200 mg  200 mg Oral TID PC & HS Encarnacion Slates, NP   200 mg at 10/02/16 1218  . hydrOXYzine (ATARAX/VISTARIL) tablet 50 mg  50 mg Oral Q6H PRN Encarnacion Slates, NP   50 mg at 10/02/16 1219  . lamoTRIgine (LAMICTAL) tablet 50 mg  50 mg Oral QPM Encarnacion Slates, NP   50 mg at 10/01/16 1709  . magnesium hydroxide (MILK OF MAGNESIA) suspension 30 mL  30 mL Oral Daily PRN Patrecia Pour, NP      . nicotine polacrilex (NICORETTE) gum 2 mg  2 mg Oral PRN Ursula Alert, MD   2 mg at 09/28/16 3557  . OLANZapine (ZYPREXA) tablet 5 mg  5 mg Oral BID PRN Ursula Alert, MD   5 mg at 09/30/16 1644   Or  . OLANZapine (ZYPREXA) injection 5 mg  5 mg Intramuscular BID PRN Ursula Alert, MD      . traZODone (DESYREL) tablet 75 mg  75 mg Oral QHS Ursula Alert, MD   75 mg at 10/01/16 2100    PTA Medications: Facility-Administered Medications Prior to Admission  Medication Dose Route Frequency Provider Last Rate Last Dose  . gi cocktail (Maalox,Lidocaine,Donnatal)  30 mL Oral Once Darreld Mclean, MD       Prescriptions Prior to Admission  Medication Sig  Dispense Refill Last Dose  . amoxicillin (AMOXIL) 500 MG capsule Take 500 mg by mouth every 6 (six) hours.  0 09/24/2016    Treatment Modalities: Medication Management, Group therapy, Case management,  1 to 1 session with clinician, Psychoeducation, Recreational therapy.   Physician Treatment Plan for Primary Diagnosis: Schizoaffective disorder, bipolar type ([place]) Long Term Goal(s): Improvement in symptoms so as ready for discharge  Short Term Goals: Ability to identify changes in lifestyle to reduce recurrence of condition will improve   Medication Management: Evaluate patient's response, side effects, and tolerance of medication regimen.  Therapeutic Interventions: 1 to 1 sessions, Unit Group sessions and Medication administration.  Evaluation of Outcomes: Adequate for Discharge  Physician Treatment Plan for Secondary Diagnosis: Principal Problem:   Schizoaffective disorder, bipolar type ([place])   Long Term Goal(s): Improvement in symptoms so as ready for discharge  Short Term Goals:  Compliance with prescribed medications will improve   Medication Management: Evaluate patient's response, side effects, and tolerance of medication regimen.  Therapeutic Interventions: 1 to 1 sessions, Unit Group sessions and Medication administration.  Evaluation of Outcomes: Adequate for Discharge   RN Treatment Plan for Primary Diagnosis: Schizoaffective disorder, bipolar type ([place]) Long Term Goal(s): Knowledge of disease and therapeutic  regimen to maintain health will improve  Short Term Goals:    Medication Management: RN will administer medications as ordered by provider, will assess and evaluate patient's response and provide education to patient for prescribed medication. RN will report any adverse and/or side effects to prescribing provider.  Therapeutic Interventions: 1 on 1 counseling sessions, Psychoeducation, Medication administration, Evaluate responses to treatment, Monitor  vital signs and CBGs as ordered, Perform/monitor CIWA, COWS, AIMS and Fall Risk screenings as ordered, Perform wound care treatments as ordered.  Evaluation of Outcomes: Progressing   Recreational Therapy Treatment Plan for Primary Diagnosis: Schizoaffective disorder, bipolar type ([place]) Long Term Goal(s): LTG- Patient will participate in recreation therapy tx in at least 2 group sessions without prompting from LRT.  Short Term Goals: Patient will spontaneously contribute to discussions during at least 2 recreational therapy sessions without prompting or encouragement by conclusion of recreation therapy tx.  Treatment Modalities: Group and Pet Therapy  Therapeutic Interventions: Psychoeducation  Evaluation of Outcomes: Adequate for Discharge   LCSW Treatment Plan for Primary Diagnosis: Schizoaffective disorder, bipolar type ([place]) Long Term Goal(s): Safe transition to appropriate next level of care at discharge, Engage patient in therapeutic group addressing interpersonal concerns.  Short Term Goals: Engage patient in aftercare planning with referrals and resources  Therapeutic Interventions: Assess for all discharge needs, 1 to 1 time with Social worker, Explore available resources and support systems, Assess for adequacy in community support network, Educate family and significant other(s) on suicide prevention, Complete Psychosocial Assessment, Interpersonal group therapy.  Evaluation of Outcomes: Met   Progress in Treatment: Attending groups: Yes Participating in groups: Yes Taking medication as prescribed: Yes Toleration medication: Yes, no side effects reported at this time Family/Significant other contact made: No Patient understands diagnosis: No  Limited insight Discussing patient identified problems/goals with staff: Yes Medical problems stabilized or resolved: Yes Denies suicidal/homicidal ideation: Yes Issues/concerns per patient self-inventory: None Other: N/A  New  problem(s) identified: None identified at this time.   New Short Term/Long Term Goal(s): None identified at this time.   Discharge Plan or Barriers: return home, follow up outpt  Reason for Continuation of Hospitalization: Anxiety Medication stabilization   Estimated Length of Stay: 1-2 days  Attendees: Patient: 10/02/2016  2:54 PM  Physician: Ursula Alert, MD 10/02/2016  2:54 PM  Nursing: Jeanie Cooks, RN 10/02/2016  2:54 PM  RN Care Manager: Lars Pinks, RN 10/02/2016  2:54 PM  Social Worker: Ripley Fraise 10/02/2016  2:54 PM  Recreational Therapist: Laretta Bolster  10/02/2016  2:54 PM  Other: Norberto Sorenson 10/02/2016  2:54 PM  Other:  10/02/2016  2:54 PM    Scribe for Treatment Team:  Roque Lias LCSW 10/02/2016 2:54 PM

## 2016-10-02 NOTE — Plan of Care (Signed)
Problem: Coping: Goal: Ability to cope will improve Outcome: Progressing Pt is able to verbalize multiple coping skills.   Problem: Health Behavior/Discharge Planning: Goal: Compliance with prescribed medication regimen will improve Outcome: Progressing Pt is compliant with scheduled medications.

## 2016-10-03 MED ORDER — AMOXICILLIN 500 MG PO CAPS: 500.0000 mg | ORAL_CAPSULE | Freq: Three times a day (TID) | ORAL | 0 refills | Status: DC

## 2016-10-03 MED ORDER — TRAZODONE HCL 150 MG PO TABS: 75.0000 mg | ORAL_TABLET | Freq: Every day | ORAL | 0 refills | Status: DC

## 2016-10-03 MED ORDER — HYDROXYZINE HCL 50 MG PO TABS: 50.0000 mg | ORAL_TABLET | Freq: Four times a day (QID) | ORAL | 0 refills | Status: DC | PRN

## 2016-10-03 MED ORDER — ASENAPINE MALEATE 5 MG SL SUBL: 10.0000 mg | SUBLINGUAL_TABLET | Freq: Every day | SUBLINGUAL | 0 refills | Status: DC

## 2016-10-03 MED ORDER — LAMOTRIGINE 25 MG PO TABS: 50.0000 mg | ORAL_TABLET | Freq: Every evening | ORAL | 0 refills | Status: DC

## 2016-10-03 MED ORDER — NICOTINE POLACRILEX 2 MG MT GUM: 2.0000 mg | CHEWING_GUM | OROMUCOSAL | 0 refills | Status: DC | PRN

## 2016-10-03 MED ORDER — GABAPENTIN 100 MG PO CAPS: 200.0000 mg | ORAL_CAPSULE | Freq: Three times a day (TID) | ORAL | 0 refills | Status: DC

## 2016-10-03 NOTE — [facility] Suicide Risk Assessment (Signed)
[facility]   Principal Problem: Schizoaffective disorder, bipolar type [facility](HCC) Discharge Diagnoses:  Patient Active Problem List   Diagnosis Date Noted  . Schizoaffective disorder, bipolar type (HCC) [F25.0] 09/27/2016  . Chest pain [R07.9] 07/19/2014  . Systolic murmur [R01.1] 07/19/2014    Total Time spent with patient: 30 minutes  Musculoskeletal: Strength & Muscle Tone: within normal limits Gait & Station: normal Patient leans: N/A  Psychiatric Specialty Exam: Review of Systems  Psychiatric/Behavioral: Negative for depression and suicidal ideas. The patient is not nervous/anxious.   All other systems reviewed and are negative.   Blood pressure 111/73, pulse 68, temperature 98.6 F (37 C), temperature source Oral, resp. rate 18, height 5\' 3"  (1.6 m), weight 67.6 kg (149 lb), SpO2 100 %.Body mass index is 26.39 kg/m.  General Appearance: Casual  Eye Contact::  Fair  Speech:  Clear and Coherent409  Volume:  Normal  Mood:  Euthymic  Affect:  Appropriate  Thought Process:  Goal Directed and Descriptions of Associations: Intact  Orientation:  Full (Time, Place, and Person)  Thought Content:  Logical  Suicidal Thoughts:  No  Homicidal Thoughts:  No  Memory:  Immediate;   Fair Recent;   Fair Remote;   Fair  Judgement:  Fair  Insight:  Fair  Psychomotor Activity:  Normal  Concentration:  Fair  Recall:  FiservFair  Fund of Knowledge:Fair  Language: Fair  Akathisia:  No  Handed:  Right  AIMS (if indicated):     Assets:  Desire for Improvement  Sleep:  Number of Hours: 6.5  Cognition: WNL  ADL's:  Intact   Mental Status Per Nursing Assessment::   On Admission:  NA  Demographic Factors:  Caucasian  Loss Factors: NA  Historical Factors: Impulsivity  Risk Reduction Factors:   Positive social support and Positive therapeutic relationship  Continued Clinical Symptoms:  Previous Psychiatric Diagnoses and Treatments  Cognitive Features  That Contribute To Risk:  None    Suicide Risk:  Minimal: No identifiable suicidal ideation.  Patients presenting with no risk factors but with morbid ruminations; may be classified as minimal risk based on the severity of the depressive symptoms  Follow-up Information    [facility] 10/26/2016.   Why:  [facility]  Contact information: [address]  [place] [336] [phone]           Plan Of Care/Follow-up recommendations:  Activity:  no restrictions Diet:  regular Tests:  as needed Other:  follow up with aftercare  Filipe Greathouse, MD 10/03/2016, 9:40 AM

## 2016-10-03 NOTE — Progress Notes (Signed)
Recreation Therapy Notes  Date: 10/03/16 Time: 1000 Location: 500 Hall Dayroom  Group Topic: Leisure Education  Goal Area(s) Addresses:  Patient will identify positive leisure activities.  Patient will identify one positive benefit of participation in leisure activities.   Behavioral Response: Engaged  Intervention: Various leisure activities on strips of paper, dry erase board, dry erase marker, stopwatch   Activity: Leisure Pictionary.  Patients were divided into teams of 2 and given one minute to guess as many activities as possible.  One person from Team 1 would come up, select a strip of paper from the can and draw the activity on the board or act it out.  If their team guessed the activity, the person was to get another activity from the can.  This would continue until the time ran out.  Whatever activity they ended on, if they were unable to guess the activity, Team 2 would get the opportunity to "steal" the point.  Team 2 would start the process over again.  The team with the most points wins.  Education:  Leisure Education, Building control surveyorDischarge Planning  Education Outcome: Acknowledges education/In group clarification offered/Needs additional education  Clinical Observations/Feedback: Pt stated that leisure "allows you to get a physiological balance".  Pt was bright, active and engaged well with her peers.  Pt also encouraged peers throughout group.  Pt also exhibited appropriate competitiveness.   Caroll RancherMarjette Leanny Moeckel, LRT/CTRS         Caroll RancherLindsay, Kenny Rea A 10/03/2016 11:57 AM

## 2016-10-03 NOTE — Progress Notes (Signed)
  [facility] :  Will you be returning to the same living situation after discharge:  Yes,  home At discharge, do you have transportation home?: Yes,  family Do you have the ability to pay for your medications: Yes,  insurance  Release of information consent forms completed and in the chart;  Patient's signature needed at discharge.  Patient to Follow up at: Follow-up Information    [facility] Follow [facility] on 10/26/2016.   Why:  Friday at 1:00 [facility]  Contact information: [address]  [place] [336] [phone]           Next level of care provider has [facility] Link:no  Safety Planning and Suicide Prevention discussed: Yes,  yes  Have you used any form of tobacco in the last 30 days? (Cigarettes, Smokeless Tobacco, Cigars, and/or Pipes): Yes  Has patient been referred to the Quitline?: Patient refused referral  Patient has been referred for addiction treatment: N/A  Anita Ball 10/03/2016, 11:20 AM

## 2016-10-03 NOTE — Discharge Summary (Signed)
Physician Discharge Summary Note  Patient:  Anita Anita Ball is Anita Ball 40 y.o., female MRN:  [id number] DOB:  12-18-1975 Patient phone:  [phone] (home)  Patient address:   [address] [place] [id number],   Total Time spent with patient: Greater than 30 minutes  Date of Admission:  09/26/2016  Date of Discharge: 10-03-16  Reason for Admission: Worsening symptoms of Psychosis  Principal Problem: Schizoaffective disorder, bipolar type [facility](HCC)  Discharge Diagnoses: Patient Active Problem List   Diagnosis Date Noted  . Schizoaffective disorder, bipolar type (HCC) [F25.0] 09/27/2016  . Chest pain [R07.9] 07/19/2014  . Systolic murmur [R01.1] 07/19/2014   Past Psychiatric History: Schizoaffective disorder, bipolar-type, manic episodes.  Past Medical History:  Past Medical History:  Diagnosis Date  . Anxiety   . Depression   . Heart murmur    History reviewed. No pertinent surgical history.  Family History:  Family History  Problem Relation Age of Onset  . Depression Mother   . Alcoholism Father    Family Psychiatric  History: See H&P  Social History:  History  Alcohol Use No     History  Drug Use No    Social History   Social History  . Marital status: Widowed    Spouse name: N/A  . Number of children: N/A  . Years of education: N/A   Social History Main Topics  . Smoking status: Current Every Day Smoker    Packs/day: 0.25  . Smokeless tobacco: Never Used  . Alcohol use No  . Drug use: No  . Sexual activity: Yes   Other Topics Concern  . None   Social History Narrative  . None   Hospital Course: Anita Anita Ball, 40 yo female, [facility] from parents. They reported that patient was "spiritually channeling energy" and was chanting random sounds.  When Anita was seen in assessment, Anita presented in a calm demeanor but it is evident that Anita is tangential and had pressured speech.  Anita reports that her parents had her  brought here against her will.  Anita reports that Anita is a "medium" and that Anita has a gift and a purpose to help others.  Anita reports that her parents just don't understand that Anita has a form of higher energy, that Anita has a hard time relating in this world because Anita goes in and out of different dimensions.  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  Anita reports that Anita is hyper sensitive at the current time wherein Anita is hearing voices, thoughts and feelings all the time.  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  Anita [facility] Ball [facility] due to worsening symptoms of Bipolar disorder with psychotic features. Anita presented to the hospital under Anita Ball IVC petition highly psychotic, delusional & confused. Anita Anita Ball believed within herself that Anita has the ability to connect with the spirits. Anita stated that Anita has the ability to pick-up energy from other people/sources & about to embark on a book tour. Anita stated the information contained in the book were furnished by the spirits/guys. Anita presented manic with pressured speech & had difficulty with concentration. Anita was in desparate need for mood stabilization  treatments.   After evaluation of Anita Anita Ball's symptoms, Anita was treated with medication regimen that targeted those presenting symptoms. medication indications & side effects were explained to her. However, Anita Anita Ball was very particular about medications. Anita specifically requested to be on Saphris as Anita believed it contains natural ingredients compatible to her system. Her request was granted. Anita was made aware that Saphris is very expensive & her insurance company may have problems approving it's payment. Anita Anita Ball was also  medicated & discharged on; Gabapentin 200 mg for agitation, Hydroxyzine 50 mg prn for anxiety, Lamictal 50 mg for mood stabilization, Nicorette gum for smoking cessation & Trazodone 150 mg for insomnia. Anita received other medication regimen for the other medical issues presented. Anita tolerated her treatment regimen without any adverse effects or reactions reported.  During the course of her hosptalization, Anita Anita Ball's improvement was monitored by observation & her daily report of symptom reduction noted.  Her emotional & mental status were monitored by daily self inventory reports completed by her & the clinical staff. Anita reported continued improvement on daily basis & denied any new concerns. Anita Anita Ball was encouraged to attend groups to help with recognizing triggers of emotional crises & ways to cope better with them.         Anita Anita Ball was evaluated by the treatment team on daily basis for mood stability and plans for continued recovery after discharge. Anita was offered further treatment options upon discharge on Anita Ball outpatient basis as noted below.  Anita was encouraged to maintain satisfactory support system/network at her home/work environment. Anita was instructed & encouraged to adhere to her medication regimen as recommended by his treatment team to maintain mood stability.    Anita Anita Ball's motivation was Anita Ball integral factor in her mood stability. Anita worked closely with the treatment to workout her discharge plans. Upon discharge, Anita was both mentally and medically stable denying suicidal/homicidal ideation, auditory/visual/tactile hallucinations, delusional thoughts & or paranoia. Anita left [facility] with all personal belongings in no apparent distress.  Transportation per family.Marland Kitchen   Physical Findings: AIMS: Facial and Oral Movements Muscles of Facial Expression: None, normal Lips and Perioral Area: None, normal Jaw: None, normal Tongue: None, normal,Extremity Movements Upper (arms, wrists, hands,  fingers): None, normal Lower (legs, knees, ankles, toes): None, normal, Trunk Movements Neck, shoulders, hips: None, normal, Overall Severity Severity of abnormal movements (highest score from questions above): None, normal Incapacitation due to abnormal movements: None, normal Patient's awareness of abnormal movements (rate only patient's report): No Awareness, Dental Status Current problems with teeth and/or dentures?: No Does patient usually wear dentures?: No  CIWA:    COWS:     Musculoskeletal: Strength & Muscle Tone: within normal limits Gait & Station: normal Patient leans: N/A  Psychiatric Specialty Exam: Physical Exam  Constitutional: Anita is oriented to person, place, and time. Anita appears well-developed.  HENT:  Head: Normocephalic.  Eyes: Pupils are equal, round, and reactive to light.  Neck: Normal range of motion.  Cardiovascular: Normal rate.   Respiratory: Effort normal.  GI: Soft.  Genitourinary:  Genitourinary Comments: Denies any issues in thi area.  Musculoskeletal: Normal range of motion.  Neurological: Anita is alert and oriented to person, place, and time.  Skin: Skin is warm and dry.    Review of Systems  Constitutional: Negative.   HENT: Negative.   Eyes: Negative.   Respiratory: Negative.   Cardiovascular: Negative.   Gastrointestinal: Negative.   Genitourinary: Negative.   Musculoskeletal: Negative.   Skin: Negative.   Neurological: Negative.  Endo/Heme/Allergies: Negative.   Psychiatric/Behavioral: Positive for depression (Stable), hallucinations (Hx. Psychosis (auditory hallucinations)) and substance abuse (Hx. Cannabis abuse). Negative for memory loss and suicidal ideas. The patient has insomnia (Stable). The patient is not nervous/anxious.     Blood pressure 111/73, pulse 68, temperature 98.6 F (37 C), temperature source Oral, resp. rate 18, height 5\' 3"  (1.6 m), weight 67.6 kg (149 lb), SpO2 100 %.Body mass index is 26.39 kg/m.  See Md's  SRA   Have you used any form of tobacco in the last 30 days? (Cigarettes, Smokeless Tobacco, Cigars, and/or Pipes): Yes  Has this patient used any form of tobacco in the last 30 days? (Cigarettes, Smokeless Tobacco, Cigars, and/or Pipes): Yes, provided with a nicorette gum prescription.   Blood Alcohol level:  Lab Results  Component Value Date   ETH <5 09/25/2016   Metabolic Disorder Labs:  Lab Results  Component Value Date   HGBA1C 5.2 09/28/2016   MPG 103 09/28/2016   Lab Results  Component Value Date   PROLACTIN 20.4 09/28/2016   Lab Results  Component Value Date   CHOL 141 09/28/2016   TRIG 88 09/28/2016   HDL 58 09/28/2016   CHOLHDL 2.4 09/28/2016   VLDL 18 09/28/2016   LDLCALC 65 09/28/2016   LDLCALC 84 08/25/2014   See Psychiatric Specialty Exam and Suicide Risk Assessment completed by Attending Physician prior to discharge.  Discharge destination:  Home  Is patient on multiple antipsychotic therapies at discharge:  No   Has Patient had three or more failed trials of antipsychotic monotherapy by history:  No  Recommended Plan for Multiple Antipsychotic Therapies: NA    Medication List    TAKE these medications     Indication  amoxicillin 500 MG capsule Commonly known as:  AMOXIL Take 1 capsule (500 mg total) by mouth 4 (four) times daily -  with meals and at bedtime. For infection What changed:  when to take this  additional instructions  Indication:  infection   asenapine 5 MG Subl 24 hr tablet Commonly known as:  SAPHRIS Place 2 tablets (10 mg total) under the tongue at bedtime. For mood control  Indication:  Mood control   gabapentin 100 MG capsule Commonly known as:  NEURONTIN Take 2 capsules (200 mg total) by mouth 4 (four) times daily - after meals and at bedtime. For agitation  Indication:  Agitation   hydrOXYzine 50 MG tablet Commonly known as:  ATARAX/VISTARIL Take 1 tablet (50 mg total) by mouth every 6 (six) hours as needed for  anxiety.  Indication:  Anxiety Neurosis   lamoTRIgine 25 MG tablet Commonly known as:  LAMICTAL Take 2 tablets (50 mg total) by mouth every evening. For mood stabilization  Indication:  Mood stabilization   nicotine polacrilex 2 MG gum Commonly known as:  NICORETTE Take 1 each (2 mg total) by mouth as needed for smoking cessation.  Indication:  Nicotine Addiction   traZODone 150 MG tablet Commonly known as:  DESYREL Take 0.5 tablets (75 mg total) by mouth at bedtime. For sleep  Indication:  Trouble Sleeping      Follow-up Information    [facility]2016.   Why:  Friday at 1:00 [facility]  Contact information: [address]  [place] [[address]] [place] [phone]          Follow-up recommendations: Activity:  As tolerated Diet: As recommended by your primary care doctor. Keep all scheduled follow-up appointments as recommended.  Comments: Patient is instructed prior to discharge to: Take all medications as prescribed by his/her mental healthcare provider. Report any adverse effects and or reactions from the medicines to his/her outpatient provider promptly. Patient has been instructed & cautioned: To not engage in alcohol and or illegal drug use while on prescription medicines. In the event of worsening symptoms, patient is instructed to call the crisis hotline, 911 and or go to the nearest ED for appropriate evaluation and treatment of symptoms. To follow-up with his/her primary care provider for your other medical issues, concerns and or health care needs.   Signed: Sanjuana Kava, NP, PMHNP, FNP-BC 10/03/2016, 1:12 PM

## 2016-10-03 NOTE — Plan of Care (Signed)
Problem: [facility] Participation in Recreation Therapeutic Interventions Goal: STG-Patient will demonstrate improved communication skills b STG: Communication - Patient will improve communication skills, as demonstrated by ability to actively participate in at least 2 processing discussion during recreation therapy group sessions by conclusion of recreation therapy tx  Outcome: Completed/Met Date Met: 10/03/16 Pt was able to show improved communication at the completion of anger management, coping skills and leisure education recreation therapy sessions.  Victorino Sparrow, LRT/CTRS

## 2016-10-03 NOTE — Progress Notes (Signed)
Patient discharged to lobby. Patient was stable and appreciative at that time. All papers and prescriptions were given and valuables returned. Verbal understanding expressed. Denies SI/HI and A/VH. Patient given opportunity to express concerns and ask questions.  

## 2016-10-10 ENCOUNTER — Telehealth (HOSPITAL_COMMUNITY): Payer: Self-pay | Admitting: *Deleted

## 2016-10-10 NOTE — Telephone Encounter (Signed)
Prior authorization for [facility] received. Upon chart review, patient is not seen in outpatient department. This office will not be able to complete.

## 2017-08-17 ENCOUNTER — Ambulatory Visit (INDEPENDENT_AMBULATORY_CARE_PROVIDER_SITE_OTHER): Payer: BLUE CROSS/BLUE SHIELD | Admitting: Physician Assistant

## 2017-08-17 ENCOUNTER — Encounter: Payer: Self-pay | Admitting: Physician Assistant

## 2017-08-17 VITALS — BP 118/80 | HR 75 | Temp 99.1°F | Resp 18 | Ht 63.0 in | Wt 186.8 lb

## 2017-08-17 DIAGNOSIS — R1013 Epigastric pain: Secondary | ICD-10-CM

## 2017-08-17 DIAGNOSIS — K219 Gastro-esophageal reflux disease without esophagitis: Secondary | ICD-10-CM

## 2017-08-17 LAB — POCT CBC
Granulocyte percent: 67.3 %G (ref 37–80)
HEMATOCRIT: 41.9 % (ref 37.7–47.9)
Hemoglobin: 14.3 g/dL (ref 12.2–16.2)
LYMPH, POC: 3.3 (ref 0.6–3.4)
MCH, POC: 31.6 pg — AB (ref 27–31.2)
MCHC: 34 g/dL (ref 31.8–35.4)
MCV: 92.7 fL (ref 80–97)
MID (CBC): 0.1 (ref 0–0.9)
MPV: 8.6 fL (ref 0–99.8)
PLATELET COUNT, POC: 226 10*3/uL (ref 142–424)
POC GRANULOCYTE: 7 — AB (ref 2–6.9)
POC LYMPH %: 31.3 % (ref 10–50)
POC MID %: 1.4 %M (ref 0–12)
RBC: 4.52 M/uL (ref 4.04–5.48)
RDW, POC: 12.9 %
WBC: 10.4 10*3/uL — AB (ref 4.6–10.2)

## 2017-08-17 LAB — POCT URINALYSIS DIP (MANUAL ENTRY)
BILIRUBIN UA: NEGATIVE mg/dL
Bilirubin, UA: NEGATIVE
GLUCOSE UA: NEGATIVE mg/dL
LEUKOCYTES UA: NEGATIVE
Nitrite, UA: NEGATIVE
PROTEIN UA: NEGATIVE mg/dL
SPEC GRAV UA: 1.015 (ref 1.010–1.025)
UROBILINOGEN UA: 0.2 U/dL
pH, UA: 7 (ref 5.0–8.0)

## 2017-08-17 MED ORDER — PANTOPRAZOLE SODIUM 40 MG PO TBEC: 40.0000 mg | DELAYED_RELEASE_TABLET | Freq: Every day | ORAL | 0 refills | Status: DC

## 2017-08-17 MED ORDER — FAMOTIDINE 20 MG PO TABS: 20.0000 mg | ORAL_TABLET | Freq: Every day | ORAL | 0 refills | Status: DC

## 2017-08-17 NOTE — Patient Instructions (Addendum)
Will call with results.     IF you received an x-ray today, you will receive an invoice from [facility]. Please contact [facility] at [phone] with questions or concerns regarding your invoice.   IF you received labwork today, you will receive an invoice from [facility]. Please contact [facility] at [phone] with questions or concerns regarding your invoice.   Our billing staff will not be able to assist you with questions regarding bills from these companies.  You will be contacted with the lab results as soon as they are available. The fastest way to get your results is to activate your My Chart account. Instructions are located on the last page of this paperwork. If you have not heard from us regarding the results in 2 weeks, please contact this office.

## 2017-08-17 NOTE — Progress Notes (Signed)
08/17/2017 3:55 PM   DOB: 24-Feb-1976 / MRN: [id number]  SUBJECTIVE:  Anita Ball is a 41 y.o. female presenting for abdominal pain and tells me this is burning and aching in the right upper quadrant.  Tells me this radiated to her back this morning. States that eating makes the pain worse but can not further define a temporal relationship. Did have a bowel movement this morning which was soft and non bloody. She does not drink much. She still has her gall bladder. No difficulty with breathing. SHe tells me that she does not really have GERD.   She has No Known Allergies.   She  has a past medical history of Anxiety; Depression; Heart murmur; and Seizures ([place]).    She  reports that she has been smoking.  She has been smoking about 0.25 packs per day. She has never used smokeless tobacco. She reports that she uses drugs, including Marijuana. She reports that she does not drink alcohol. She  reports that she currently engages in sexual activity. The patient  has a past surgical history that includes Fracture surgery.  Her family history includes Alcoholism in her father; Depression in her mother.  Review of Systems  Respiratory: Negative for cough and shortness of breath.     The problem list and medications were reviewed and updated by myself where necessary and exist elsewhere in the encounter.   OBJECTIVE:  BP 118/80 (BP Location: Left Arm, Patient Position: Sitting, Cuff Size: Large)   Pulse 75   Temp 99.1 F (37.3 C) (Oral)   Resp 18   Ht '5\' 3"'  (1.6 m)   Wt 186 lb 12.8 oz (84.7 kg)   LMP 06/29/2017 (Approximate) Comment: Pt thinks she is in early menopause. Pt states last period was just spotting.  SpO2 99%   BMI 33.09 kg/m   Physical Exam  Constitutional: She is active.  Non-toxic appearance.  Cardiovascular: Normal rate, regular rhythm, S1 normal, S2 normal, normal heart sounds and intact distal pulses.  Exam reveals no gallop, no friction rub and no decreased  pulses.   No murmur heard. Pulmonary/Chest: Effort normal. No stridor. No tachypnea. No respiratory distress. She has no wheezes. She has no rales.  Abdominal: She exhibits no distension.  Musculoskeletal: She exhibits no edema.  Neurological: She is alert.  Skin: Skin is warm and dry. She is not diaphoretic. No pallor.    Results for orders placed or performed in visit on 08/17/17 (from the past 72 hour(s))  POCT urinalysis dipstick     Status: Abnormal   Collection Time: 08/17/17  3:06 PM  Result Value Ref Range   Color, UA yellow yellow   Clarity, UA clear clear   Glucose, UA negative negative mg/dL   Bilirubin, UA negative negative   Ketones, POC UA negative negative mg/dL   Spec Grav, UA 1.015 1.010 - 1.025   Blood, UA trace-lysed (A) negative   pH, UA 7.0 5.0 - 8.0   Protein Ur, POC negative negative mg/dL   Urobilinogen, UA 0.2 0.2 or 1.0 E.U./dL   Nitrite, UA Negative Negative   Leukocytes, UA Negative Negative  POCT CBC     Status: Abnormal   Collection Time: 08/17/17  3:26 PM  Result Value Ref Range   WBC 10.4 (A) 4.6 - 10.2 K/uL   Lymph, poc 3.3 0.6 - 3.4   POC LYMPH PERCENT 31.3 10 - 50 %L   MID (cbc) 0.1 0 - 0.9   POC MID %  1.4 0 - 12 %M   POC Granulocyte 7.0 (A) 2 - 6.9   Granulocyte percent 67.3 37 - 80 %G   RBC 4.52 4.04 - 5.48 M/uL   Hemoglobin 14.3 12.2 - 16.2 g/dL   HCT, POC 41.9 37.7 - 47.9 %   MCV 92.7 80 - 97 fL   MCH, POC 31.6 (A) 27 - 31.2 pg   MCHC 34.0 31.8 - 35.4 g/dL   RDW, POC 12.9 %   Platelet Count, POC 226 142 - 424 K/uL   MPV 8.6 0 - 99.8 fL   Hepatic Function Latest Ref Rng & Units 09/25/2016  Total Protein 6.5 - 8.1 g/dL 7.1  Albumin 3.5 - 5.0 g/dL 4.3  AST 15 - 41 U/L 34  ALT 14 - 54 U/L 27  Alk Phosphatase 38 - 126 U/L 38  Total Bilirubin 0.3 - 1.2 mg/dL 0.9   The 10-year ASCVD risk score Mikey Bussing DC Jr., et al., 2013) is: 0.9%   Values used to calculate the score:     Age: 59 years     Sex: Female     Is Non-Hispanic African  American: No     Diabetic: No     Tobacco smoker: Yes     Systolic Blood Pressure: 364 mmHg     Is BP treated: No     HDL Cholesterol: 58 mg/dL     Total Cholesterol: 141 mg/dL    No results found.  ASSESSMENT AND PLAN:  Surah was seen today for abdominal pain and back pain.   Diagnoses and all orders for this visit:  Epigastric pain: Mild leukocytosis however she is non toxic with a non focal abdominal exam. Will treat for GERD and assess labs.  -     POCT CBC -     POCT urinalysis dipstick -     Lipase -     CMP and Liver -     H. pylori breath test -     EKG; Future -     EKG  Gastroesophageal reflux disease, esophagitis presence not specified -     pantoprazole (PROTONIX) 40 MG tablet; Take 1 tablet (40 mg total) by mouth daily. Take thirty minutes before the first meal of the day. -     famotidine (PEPCID) 20 MG tablet; Take 1 tablet (20 mg total) by mouth at bedtime.    The patient is advised to call or return to clinic if she does not see an improvement in symptoms, or to seek the care of the closest emergency department if she worsens with the above plan.   Philis Fendt, MHS, PA-C [facility] 08/17/2017 3:55 PM

## 2017-08-18 LAB — CMP AND LIVER
ALT: 23 IU/L (ref 0–32)
AST: 22 IU/L (ref 0–40)
Albumin: 4.4 g/dL (ref 3.5–5.5)
Alkaline Phosphatase: 41 IU/L (ref 39–117)
BUN: 7 mg/dL (ref 6–24)
Bilirubin Total: 0.3 mg/dL (ref 0.0–1.2)
Bilirubin, Direct: 0.09 mg/dL (ref 0.00–0.40)
CALCIUM: 9.6 mg/dL (ref 8.7–10.2)
CO2: 21 mmol/L (ref 20–29)
Chloride: 105 mmol/L (ref 96–106)
Creatinine, Ser: 0.8 mg/dL (ref 0.57–1.00)
GFR, EST AFRICAN AMERICAN: 107 mL/min/{1.73_m2} (ref >59)
GFR, EST NON AFRICAN AMERICAN: 93 mL/min/{1.73_m2} (ref >59)
GLUCOSE: 81 mg/dL (ref 65–99)
Potassium: 4.1 mmol/L (ref 3.5–5.2)
Sodium: 142 mmol/L (ref 134–144)
Total Protein: 6.5 g/dL (ref 6.0–8.5)

## 2017-08-18 LAB — LIPASE: LIPASE: 31 U/L (ref 14–72)

## 2017-08-19 NOTE — Progress Notes (Signed)
Please make patient aware of results via letter. In the context of her overall presentation any abnormal values are of no clinical significance.  Anita BostonMichael Haniyah Maciolek PA-C, 08/19/2017 8:52 AM

## 2017-08-20 LAB — H. PYLORI BREATH TEST: H PYLORI BREATH TEST: NEGATIVE

## 2017-08-21 ENCOUNTER — Telehealth: Payer: Self-pay

## 2017-08-21 NOTE — Telephone Encounter (Signed)
Call placed to make patient aware, no answer on patient phone. Left message for patient to return call./ S.Uzziah Rigg,CMA

## 2017-08-21 NOTE — Telephone Encounter (Signed)
-----   Message from Ofilia NeasMichael L Clark, PA-C sent at 08/21/2017  8:12 AM EDT ----- Please let her know the h pyolir test was negative.  If she is feeling better then she can continue the medications as prescibed.  If she is not feeling better I would like to see her back for a recheck. Deliah BostonMichael Clark, MS, PA-C 8:11 AM, 08/21/2017

## 2017-08-21 NOTE — Progress Notes (Signed)
Please let her know the h pyolir test was negative.  If she is feeling better then she can continue the medications as prescibed.  If she is not feeling better I would like to see her back for a recheck. Deliah BostonMichael Micole Delehanty, MS, PA-C 8:11 AM, 08/21/2017

## 2018-10-21 ENCOUNTER — Inpatient Hospital Stay (HOSPITAL_COMMUNITY)
Admission: AD | Admit: 2018-10-21 | Discharge: 2018-10-24 | DRG: 885 | Disposition: A | Discharge status: Home / Self Care | Payer: Federal, State, Local not specified - Other | Source: Intra-hospital | Attending: Psychiatry | Admitting: Psychiatry

## 2018-10-21 ENCOUNTER — Encounter (HOSPITAL_COMMUNITY): Payer: Self-pay

## 2018-10-21 ENCOUNTER — Encounter (HOSPITAL_COMMUNITY): Payer: Self-pay | Admitting: *Deleted

## 2018-10-21 ENCOUNTER — Emergency Department (HOSPITAL_COMMUNITY)
Admission: EM | Admit: 2018-10-21 | Discharge: 2018-10-21 | Disposition: A | Discharge status: Other Acute Inpatient Hospital | Payer: BLUE CROSS/BLUE SHIELD | Attending: Emergency Medicine | Admitting: Emergency Medicine

## 2018-10-21 ENCOUNTER — Other Ambulatory Visit: Payer: Self-pay

## 2018-10-21 DIAGNOSIS — Z811 Family history of alcohol abuse and dependence: Secondary | ICD-10-CM | POA: Diagnosis not present

## 2018-10-21 DIAGNOSIS — R45851 Suicidal ideations: Secondary | ICD-10-CM | POA: Diagnosis present

## 2018-10-21 DIAGNOSIS — F419 Anxiety disorder, unspecified: Secondary | ICD-10-CM | POA: Diagnosis present

## 2018-10-21 DIAGNOSIS — G4700 Insomnia, unspecified: Secondary | ICD-10-CM | POA: Diagnosis present

## 2018-10-21 DIAGNOSIS — F250 Schizoaffective disorder, bipolar type: Secondary | ICD-10-CM | POA: Diagnosis present

## 2018-10-21 DIAGNOSIS — Z818 Family history of other mental and behavioral disorders: Secondary | ICD-10-CM | POA: Diagnosis not present

## 2018-10-21 DIAGNOSIS — F17210 Nicotine dependence, cigarettes, uncomplicated: Secondary | ICD-10-CM | POA: Diagnosis present

## 2018-10-21 DIAGNOSIS — F332 Major depressive disorder, recurrent severe without psychotic features: Secondary | ICD-10-CM | POA: Insufficient documentation

## 2018-10-21 DIAGNOSIS — F1210 Cannabis abuse, uncomplicated: Secondary | ICD-10-CM | POA: Diagnosis present

## 2018-10-21 LAB — COMPREHENSIVE METABOLIC PANEL
ALT: 29 U/L (ref 0–44)
ANION GAP: 11 (ref 5–15)
AST: 24 U/L (ref 15–41)
Albumin: 4.7 g/dL (ref 3.5–5.0)
Alkaline Phosphatase: 31 U/L — ABNORMAL LOW (ref 38–126)
BUN: 11 mg/dL (ref 6–20)
CALCIUM: 9.1 mg/dL (ref 8.9–10.3)
CO2: 24 mmol/L (ref 22–32)
Chloride: 102 mmol/L (ref 98–111)
Creatinine, Ser: 0.69 mg/dL (ref 0.44–1.00)
Glucose, Bld: 112 mg/dL — ABNORMAL HIGH (ref 70–99)
Potassium: 3.5 mmol/L (ref 3.5–5.1)
SODIUM: 137 mmol/L (ref 135–145)
Total Bilirubin: 0.7 mg/dL (ref 0.3–1.2)
Total Protein: 7.2 g/dL (ref 6.5–8.1)

## 2018-10-21 LAB — RAPID URINE DRUG SCREEN, HOSP PERFORMED
AMPHETAMINES: NOT DETECTED
Barbiturates: NOT DETECTED
Benzodiazepines: NOT DETECTED
Cocaine: NOT DETECTED
OPIATES: NOT DETECTED
Tetrahydrocannabinol: POSITIVE — AB

## 2018-10-21 LAB — CBC
HCT: 42.1 % (ref 36.0–46.0)
Hemoglobin: 14 g/dL (ref 12.0–15.0)
MCH: 31.1 pg (ref 26.0–34.0)
MCHC: 33.3 g/dL (ref 30.0–36.0)
MCV: 93.6 fL (ref 80.0–100.0)
PLATELETS: 250 10*3/uL (ref 150–400)
RBC: 4.5 MIL/uL (ref 3.87–5.11)
RDW: 13.4 % (ref 11.5–15.5)
WBC: 12.7 10*3/uL — ABNORMAL HIGH (ref 4.0–10.5)
nRBC: 0 % (ref 0.0–0.2)

## 2018-10-21 LAB — I-STAT BETA HCG BLOOD, ED (MC, WL, AP ONLY): I-stat hCG, quantitative: <5 m[IU]/mL (ref <5)

## 2018-10-21 LAB — SALICYLATE LEVEL

## 2018-10-21 LAB — ACETAMINOPHEN LEVEL

## 2018-10-21 LAB — ETHANOL

## 2018-10-21 MED ORDER — ONDANSETRON HCL 4 MG PO TABS: 4.0000 mg | ORAL_TABLET | Freq: Three times a day (TID) | ORAL | Status: DC | PRN

## 2018-10-21 MED ORDER — NICOTINE 7 MG/24HR TD PT24: 7.0000 mg | MEDICATED_PATCH | Freq: Every day | TRANSDERMAL | Status: DC

## 2018-10-21 MED ORDER — ALUM & MAG HYDROXIDE-SIMETH 200-200-20 MG/5ML PO SUSP: 30.0000 mL | Freq: Four times a day (QID) | ORAL | Status: DC | PRN

## 2018-10-21 MED ORDER — ACETAMINOPHEN 325 MG PO TABS: 650.0000 mg | ORAL_TABLET | Freq: Four times a day (QID) | ORAL | Status: DC | PRN

## 2018-10-21 MED ORDER — NICOTINE 7 MG/24HR TD PT24
7.0000 mg | MEDICATED_PATCH | Freq: Every day | TRANSDERMAL | Status: DC
  Administered 2018-10-22 – 2018-10-24 (×3): 7 mg via TRANSDERMAL
  Filled 2018-10-21 (×6): qty 1

## 2018-10-21 MED ORDER — GABAPENTIN 100 MG PO CAPS
100.0000 mg | ORAL_CAPSULE | Freq: Two times a day (BID) | ORAL | Status: DC
  Administered 2018-10-21 – 2018-10-22 (×2): 100 mg via ORAL
  Filled 2018-10-21 (×6): qty 1

## 2018-10-21 MED ORDER — TRAZODONE HCL 50 MG PO TABS: 50.0000 mg | ORAL_TABLET | Freq: Every evening | ORAL | Status: DC | PRN

## 2018-10-21 MED ORDER — MAGNESIUM HYDROXIDE 400 MG/5ML PO SUSP: 30.0000 mL | Freq: Every day | ORAL | Status: DC | PRN

## 2018-10-21 MED ORDER — HYDROXYZINE HCL 25 MG PO TABS: 25.0000 mg | ORAL_TABLET | Freq: Three times a day (TID) | ORAL | Status: DC | PRN

## 2018-10-21 MED ORDER — ASENAPINE MALEATE 5 MG SL SUBL
5.0000 mg | SUBLINGUAL_TABLET | Freq: Two times a day (BID) | SUBLINGUAL | Status: DC
  Administered 2018-10-21 – 2018-10-23 (×4): 5 mg via SUBLINGUAL
  Filled 2018-10-21 (×10): qty 1

## 2018-10-21 MED ORDER — ACETAMINOPHEN 325 MG PO TABS
650.0000 mg | ORAL_TABLET | ORAL | Status: DC | PRN
  Administered 2018-10-21: 650 mg via ORAL
  Filled 2018-10-21 (×2): qty 2

## 2018-10-21 MED ORDER — ALUM & MAG HYDROXIDE-SIMETH 200-200-20 MG/5ML PO SUSP: 30.0000 mL | ORAL | Status: DC | PRN

## 2018-10-21 MED ORDER — ONDANSETRON HCL 4 MG PO TABS
4.0000 mg | ORAL_TABLET | Freq: Three times a day (TID) | ORAL | Status: DC | PRN
  Administered 2018-10-21: 4 mg via ORAL
  Filled 2018-10-21: qty 1

## 2018-10-21 NOTE — [facility] Suicide Risk Assessment (Signed)
[facility] INPATIENT:  Family/Significant Other Suicide Prevention Education  Suicide Prevention Education:  Patient Refusal for Family/Significant Other Suicide Prevention Education: The patient Anita Ball has refused to provide written consent for family/significant other to be provided Family/Significant Other Suicide Prevention Education during admission and/or prior to discharge.  Physician notified.  Lorri FrederickWierda, Jayra Choyce Jon, LCSW 10/21/2018, 1:39 PM

## 2018-10-21 NOTE — Progress Notes (Signed)
Anita Ball is a 42 year old female pt admitted on involuntary basis. On admission, Anita Ball is tearful and agitated and repeats herself that she shouldn't be here and that she had a momentary lapse in judgment. She refused to sign any paperwork other than her belongings sheet and reports that she does not consent to anything. She was able to report that she has been having job stressors and not getting paid and having to work 7 days a week and issues with her daughter's boyfriend. She reports that she has been hospitalized in the past and reports that she is prescribed medications but reports that she does not take them. She does endorse marijuana usage but denies any other substance use. She vehemently denies SI and is able to contract for safety on the unit. She reports that she lives alone and will return to the same situation upon discharge. Anita Ball was escorted to the unit, oriented to the unit and safety maintained.

## 2018-10-21 NOTE — H&P (Signed)
Psychiatric Admission Assessment Adult  Patient Identification: DANAKA LLERA MRN:  [id number] Date of Evaluation:  10/21/2018 Chief Complaint: " It was an impulsive act " Principal Diagnosis: S/P Overdose  Diagnosis:  Active Problems:   Schizoaffective disorder, bipolar type (HCC)  History of Present Illness: 42 year old female.  Presented to ED/ admitted under IVC  following ingestion of prescribed Lamictal and Gabapentin, alcohol. She reports she took " about 6 or 7 pills". ( on 12/24) . She states overdose was impulsive and unplanned . She states " I was not even suicidal , I just wanted to rest, not think".  She states she vomited soon after ingesting medication. States she had also consumed some wine prior to to overdose , but denies any pattern of alcohol abuse or dependence, states she drinks about once a month .   She states that after overdose she contacted her employer who then called police .  Patient reports she has been depressed over recent days, but does not characterize as severe. She states " it is more like I have been stressed out". She reports  contributing psychosocial stressors, mainly  tension at work, feels bullied at work, financial concerns,with fear her home may go into foreclosure, and conflict with her daughter. Admission UDS positive for Cannabis, BAL negative She endorses some neuro-vegetative symptoms, but does not endorse changes in sleep, energy , or appetite. She reports she has not been taking psychiatric medications for years . She reports medications she overdosed on ( Lamictal, Gabapentin) had been prescribed in the past but was not taking them regularly . 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    PTSD Symptoms: some prior PTSD symptoms, but states has improved overtime.  Total Time spent with patient: 45 minutes  Past Psychiatric History: one prior psychiatric admission in 2017. At the time she was admitted under IVC for manic symptoms and disorganized behavior. At the time was diagnosed with  Schizoaffective Disorder. At the time she was discharged on Saphris, Lamictal, Neurontin. Denies psychotic symptoms , but states she has had a " lifelong gift of being psychic", and has worked as a medium. Denies prior suicidal attempts, denies history of self cutting or of self injurious behaviors . Denies history of violence. Reports anxiety ( worry, ruminations) but not panic attacks, endorses mild agoraphobia.   Is the patient at risk to self? Yes.    Has the patient been a risk to self in the past 6 months? No.  Has the patient been a risk to self within the distant past? No.  Is the patient a risk to others? No.  Has the patient been a risk to others in the past 6 months? No.  Has the patient been a risk to others within the distant past? No.   Prior Inpatient Therapy:  as above  Prior Outpatient Therapy:  no current outpatient psychiatric treatment.  Alcohol Screening: 1. How often do you have a drink containing alcohol?: 2 to 4 times a month 2. How many drinks containing alcohol do you have on a typical day when you are drinking?: 1 or 2 3. How often do you have six or more drinks on one occasion?: Never AUDIT-C Score: 2 4. How often during the last year have you found that you were not able to  stop drinking once you had started?: Never 5. How often during the last year have you failed to do what was normally expected from you becasue of drinking?: Never 6. How often during the last year have you needed a first drink in the morning to get yourself going after a heavy drinking session?: Never 7. How often during the last year have you had a feeling of guilt of remorse after  drinking?: Never 8. How often during the last year have you been unable to remember what happened the night before because you had been drinking?: Never 9. Have you or someone else been injured as a result of your drinking?: No 10. Has a relative or friend or a doctor or another health worker been concerned about your drinking or suggested you cut down?: No Alcohol Use Disorder Identification Test Final Score (AUDIT): 2 Intervention/Follow-up: AUDIT Score <7 follow-up not indicated Substance Abuse History in the last 12 months: she reports regular, sometimes daily, cannabis abuse, denies other drug abuse. Reports she drinks occasionally, denies alcohol abuse or dependence. Consequences of Substance Abuse: Does not endorse Previous Psychotropic Medications: states she was not taking medications prior to admission. In the past has been prescribed Lamictal, Gabapentin, Saphris, but has not taken for a " long time".  Psychological Evaluations: No  Past Medical History: reports history of childhood seizures as a child, states she has not had them in 20 + years . Does not take any medication for seizures .Denies other illnesses Past Medical History:  Diagnosis Date  . Anxiety   . Depression   . Heart murmur   . Seizures (HCC)     Past Surgical History:  Procedure Laterality Date  . FRACTURE SURGERY     Family History: parents alive, live together, has one brother and two sisters  Family History  Problem Relation Age of Onset  . Depression Mother   . Alcoholism Father    Family Psychiatric  History: reports father has history of depression and alcohol abuse, now in remission. No suicides in family . Tobacco Screening: smokes 1-2 PPD  Social History: widowed , has a 425 year old daughter,patient lives alone, employed  Social History   Substance and Sexual Activity  Alcohol Use Yes     Social History   Substance and Sexual Activity  Drug Use Yes  . Types: Marijuana    Additional  Social History: Marital status: Widowed Widowed, when?: 10 years ago-sudden death-we had separated a monthy before-he was an addict-died of pancreatitiis Are you sexually active?: No What is your sexual orientation?: straight Has your sexual activity been affected by drugs, alcohol, medication, or emotional stress?: na Does patient have children?: Yes How many children?: 1 How is patient's relationship with their children?: 22 daughter.  "trying to repair our relationship"  Allergies:  No Known Allergies Lab Results:  Results for orders placed or performed during the hospital encounter of 10/21/18 (from the past 48 hour(s))  Comprehensive metabolic panel     Status: Abnormal   Collection Time: 10/21/18  1:40 AM  Result Value Ref Range   Sodium 137 135 - 145 mmol/L   Potassium 3.5 3.5 - 5.1 mmol/L   Chloride 102 98 - 111 mmol/L   CO2 24 22 - 32 mmol/L   Glucose, Bld 112 (H) 70 - 99 mg/dL   BUN 11 6 - 20 mg/dL   Creatinine, Ser 1.910.69 0.44 - 1.00 mg/dL   Calcium 9.1 8.9 - 47.810.3 mg/dL   Total Protein 7.2  6.5 - 8.1 g/dL   Albumin 4.7 3.5 - 5.0 g/dL   AST 24 15 - 41 U/L   ALT 29 0 - 44 U/L   Alkaline Phosphatase 31 (L) 38 - 126 U/L   Total Bilirubin 0.7 0.3 - 1.2 mg/dL   GFR calc non Af Amer >60 >60 mL/min   GFR calc Af Amer >60 >60 mL/min   Anion gap 11 5 - 15    Comment: Performed at [facility], [address] [place], [place] [address]  Ethanol     Status: None   Collection Time: 10/21/18  1:40 AM  Result Value Ref Range   Alcohol, Ethyl (B) <10 <10 mg/dL    Comment: (NOTE) Lowest detectable limit for serum alcohol is 10 mg/dL. For medical purposes only. Performed at [facility], [address] [place], [place] [address]   Salicylate level     Status: None   Collection Time: 10/21/18  1:40 AM  Result Value Ref Range   Salicylate Lvl <7.0 2.8 - 30.0 mg/dL    Comment: Performed at [facility], [address] [place], [place] [address]  Acetaminophen level     Status: Abnormal   Collection Time: 10/21/18  1:40 AM  Result Value Ref Range   Acetaminophen (Tylenol), Serum <10 (L) 10 - 30 ug/mL    Comment: (NOTE) Therapeutic concentrations vary significantly. A range of 10-30 ug/mL  may be an effective concentration for many patients. However, some  are best treated at concentrations outside of this range. Acetaminophen concentrations >150 ug/mL at 4 hours after ingestion  and >50 ug/mL at 12 hours after ingestion are often associated with  toxic reactions. Performed at [facility], [address], [place], [place] [address]   cbc     Status: Abnormal   Collection Time: 10/21/18  1:40 AM  Result Value Ref Range   WBC 12.7 (H) 4.0 - 10.5 K/uL   RBC 4.50 3.87 - 5.11 MIL/uL   Hemoglobin 14.0 12.0 - 15.0 g/dL   HCT 60.1 09.3 - 23.5 %   MCV 93.6 80.0 - 100.0 fL   MCH 31.1 26.0 - 34.0 pg   MCHC 33.3 30.0 - 36.0 g/dL   RDW 57.3 22.0 - 25.4 %   Platelets 250 150 - 400 K/uL   nRBC 0.0 0.0 - 0.2 %    Comment: Performed at [facility], [address] [place], [place] [address]  Rapid urine drug screen (hospital performed)     Status: Abnormal   Collection Time: 10/21/18  1:40 AM  Result Value Ref Range   Opiates NONE DETECTED NONE DETECTED   Cocaine NONE DETECTED NONE DETECTED   Benzodiazepines NONE DETECTED NONE DETECTED   Amphetamines NONE DETECTED NONE DETECTED   Tetrahydrocannabinol POSITIVE (A) NONE DETECTED   Barbiturates NONE DETECTED NONE DETECTED    Comment: (NOTE) DRUG SCREEN FOR MEDICAL PURPOSES ONLY.  IF CONFIRMATION IS NEEDED FOR ANY PURPOSE, NOTIFY LAB WITHIN 5 DAYS. LOWEST DETECTABLE LIMITS FOR URINE DRUG SCREEN Drug Class                     Cutoff (ng/mL) Amphetamine and metabolites    1000 Barbiturate and metabolites    200 Benzodiazepine                 200 Tricyclics and metabolites     300 Opiates and metabolites  300 Cocaine and metabolites        300 THC                            50 Performed at [facility], [address], [place], [place] [address]   I-Stat beta hCG blood, ED     Status: None   Collection Time: 10/21/18  2:33 AM  Result Value Ref Range   I-stat hCG, quantitative <5.0 <5 mIU/mL   Comment 3            Comment:   GEST. AGE      CONC.  (mIU/mL)   <=1 WEEK        5 - 50     2 WEEKS       50 - 500     3 WEEKS       100 - 10,000     4 WEEKS     1,000 - 30,000        FEMALE AND NON-PREGNANT FEMALE:     LESS THAN 5 mIU/mL     Blood Alcohol level:  Lab Results  Component Value Date   ETH <10 10/21/2018   ETH <5 09/25/2016    Metabolic Disorder Labs:  Lab Results  Component Value Date   HGBA1C 5.2 09/28/2016   MPG 103 09/28/2016   Lab Results  Component Value Date   PROLACTIN 20.4 09/28/2016   Lab Results  Component Value Date   CHOL 141 09/28/2016   TRIG 88 09/28/2016   HDL 58 09/28/2016   CHOLHDL 2.4 09/28/2016   VLDL 18 09/28/2016   LDLCALC 65 09/28/2016   LDLCALC 84 08/25/2014    Current Medications: Current Facility-Administered Medications  Medication Dose Route Frequency Provider Last Rate Last Dose  . acetaminophen (TYLENOL) tablet 650 mg  650 mg Oral Q6H PRN Jackelyn Poling, NP      . alum & mag hydroxide-simeth (MAALOX/MYLANTA) 200-200-20 MG/5ML suspension 30 mL  30 mL Oral Q4H PRN Nira Conn A, NP      . hydrOXYzine (ATARAX/VISTARIL) tablet 25 mg  25 mg Oral TID PRN Nira Conn A, NP      . magnesium hydroxide (MILK OF MAGNESIA) suspension 30 mL  30 mL Oral Daily PRN Nira Conn A, NP      . nicotine (NICODERM CQ - dosed in mg/24 hr) patch 7 mg  7 mg Transdermal Daily Nira Conn A, NP      . ondansetron (ZOFRAN) tablet 4 mg  4 mg Oral Q8H PRN Nira Conn A, NP      . traZODone (DESYREL) tablet 50 mg  50 mg Oral QHS PRN Nira Conn A, NP       PTA Medications: No medications prior to admission.     Musculoskeletal: Strength & Muscle Tone: within normal limits Gait & Station: normal Patient leans: N/A  Psychiatric Specialty Exam: Physical Exam  Review of Systems  Constitutional: Negative.   HENT: Negative.   Eyes: Negative.   Respiratory: Negative.   Cardiovascular: Negative.   Gastrointestinal: Positive for nausea and vomiting.       Reports some nausea and vomiting yesterday, not today   Genitourinary: Negative.   Musculoskeletal: Negative.   Skin: Negative.   Neurological: Positive for seizures and headaches.       Reports history of childhood seizures, not for many years  Endo/Heme/Allergies: Negative.   Psychiatric/Behavioral: Positive for depression, substance abuse and suicidal  ideas.  All other systems reviewed and are negative.   Blood pressure (!) 153/97, pulse 81, temperature 98.7 F (37.1 C), temperature source Oral, resp. rate 18, height 5\' 3"  (1.6 m), weight 85.3 kg, SpO2 100 %.Body mass index is 33.3 kg/m.  General Appearance: Fairly Groomed  Eye Contact:  Good  Speech:  becomes pressured at times   Volume:  Normal  Mood:  reports mood is " upset", which she attributes to being on inpatient setting  Affect:  labile, tearful at times  Thought Process:  Linear and Descriptions of Associations: Intact  Orientation:  Full (Time, Place, and Person)  Thought Content:  no hallucinations, no delusions   Suicidal Thoughts:  No denies any suicidal or self injurious ideations  Homicidal Thoughts:  No denies any homicidal or violent ideations  Memory:  recent and remote grossly intact   Judgement:  Fair  Insight:  Fair  Psychomotor Activity:  Normal  Concentration:  Concentration: Fair and Attention Span: Fair  Recall:  [employer]  Fund of Knowledge:  Good  Language:  Good  Akathisia:  Negative  Handed:  Right  AIMS (if indicated):     Assets:  Communication Skills Desire for Improvement Social Support  ADL's:  Intact  Cognition:  WNL  Sleep:        Treatment Plan Summary: Daily contact with patient to assess and evaluate symptoms and progress in treatment, Medication management, Plan inpatient treatment and medications as below  Observation Level/Precautions:  15 minute checks  Laboratory:  as needed  Lipid Panel, HgbA1C , TSH  Psychotherapy:  Milieu, group therapy   Medications:  Patient reports Saphris was " very helpful" two years ago when she took it. Does not remember having had side effects, and states she felt it stabilized her mood. Start Saphris SL 5 mgrs BID Start Gabapentin 100 mgrs BID for anxiety  Consultations:  As needed   Discharge Concerns: -   Estimated LOS: 4 days   Other:     Physician Treatment Plan for Primary Diagnosis:  Schizoaffective Disorder by history  Long Term Goal(s): Improvement in symptoms so as ready for discharge  Short Term Goals: Ability to identify changes in lifestyle to reduce recurrence of condition will improve, Ability to verbalize feelings will improve, Ability to disclose and discuss suicidal ideas, Ability to demonstrate self-control will improve, Ability to identify and develop effective coping behaviors will improve and Ability to maintain clinical measurements within normal limits will improve  Physician Treatment Plan for Secondary Diagnosis: Overdose/Suicidal Attempt  Long Term Goal(s): Improvement in symptoms so as ready for discharge  Short Term Goals: Ability to identify changes in lifestyle to reduce recurrence of condition will improve, Ability to verbalize feelings will improve, Ability to disclose and discuss suicidal ideas, Ability to demonstrate self-control will improve, Ability to identify and develop effective coping behaviors will improve and Ability to maintain clinical measurements within normal limits will improve  I certify that inpatient services furnished can reasonably be expected to improve the patient's condition.    Craige CottaFernando A Takeem Krotzer, MD 12/24/20191:45  PM

## 2018-10-21 NOTE — ED Notes (Signed)
[facility] All her belongings, medication and IVC forms were sent with the officer as well.

## 2018-10-21 NOTE — [facility] Group Notes (Signed)
Pt did not attend wrap up group this evening. Pt was asleep in bed.  

## 2018-10-21 NOTE — Progress Notes (Signed)
D:  Anita Ball was in her room most of the evening.  She did get up later to use the phone and talk to peers in the day room.  She did not attend evening wrap up group.  She stated her day was OK and that she wants to focus on sleeping tonight since "I haven't slept much in the past few days."  She declined sleeping medication this evening.  She denied SI/HI or A/V hallucinations.  She denied pain or discomfort and appeared to be in no physical distress. A:  1:1 with RN for support and encouragement.  Medications as ordered.  Q 15 minute checks maintained for safety.  Encouraged participation in group and unit activities.   R:  Anita Ball remains safe on the unit.  We will continue to monitor the progress towards her goals.
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
## 2018-10-21 NOTE — ED Notes (Signed)
Poison Control notified of patient's substance ingestion. MD had already ordered EKG, cardiac monitoring and labs recommended by Poison Control. Patient's lab negative and she is alert and oriented. Patient has been cleared by Poison Control.

## 2018-10-21 NOTE — ED Notes (Signed)
Report called, [employer] called for transport.
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
## 2018-10-21 NOTE — Tx Team (Signed)
Initial Treatment Plan 10/21/2018 1:13 PM Anita HarmanKatherine L Mcenroe VHQ:[id number]    PATIENT STRESSORS: Financial difficulties Medication change or noncompliance Occupational concerns   PATIENT STRENGTHS: Ability for insight Average or above average intelligence Capable of independent living General fund of knowledge   PATIENT IDENTIFIED PROBLEMS: Stress Work concerns "I just want to go home, I had a momentary lapse in judgment"                     DISCHARGE CRITERIA:  Ability to meet basic life and health needs Improved stabilization in mood, thinking, and/or behavior Reduction of life-threatening or endangering symptoms to within safe limits Verbal commitment to aftercare and medication compliance  PRELIMINARY DISCHARGE PLAN: Attend aftercare/continuing care group Return to previous living arrangement  PATIENT/FAMILY INVOLVEMENT: This treatment plan has been presented to and reviewed with the patient, Anita Ball, and/or family member, .  The patient and family have been given the opportunity to ask questions and make suggestions.  Anita Ball, [place] Wayne, [place]RN 10/21/2018, 1:13 PM

## 2018-10-21 NOTE — BH Assessment (Signed)
BHH Assessment Progress Note  Per Nira ConnJason Berry, NP, this pt requires psychiatric hospitalization.  Selena BattenKim, RN, [facility].  Pt presents under IVC initiated by law enforcement, which EDP Dione Boozeavid Glick, MD has upheld, and IVC docutments have been faxed to [facility].  Pt's nurse, Gabriel Earingaquita, has been notified, and agrees to call report to [phone].  Pt is to be transported via Patent examinerlaw enforcement.   Doylene Canninghomas Khadim Lundberg, [place] Behavioral Health Coordinator [phone]
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
## 2018-10-21 NOTE — ED Provider Notes (Signed)
[facility]-EMERGENCY DEPT Provider Note   CSN: [id number] Arrival date & time: 10/21/18  0014     History   Chief Complaint Chief Complaint  Patient presents with  . Suicidal    HPI Anita Ball is a 42 y.o. female.  The history is provided by the patient.  He has history of schizoaffective disorder bipolar type, seizures and is brought in following ingestion of multiple pills.  She would not tell me what she took, but she is reported to have taken 6-10 gabapentin, 1-2 Lamictal, 1 Advil PM, 2 regular Advil and admits to drinking a couple of glasses of wine.  She does relate that she is under a lot of stress including the fact that she is not able to make payment in her house that she will lose her house.  She reported suicidal ideation to RN at triage, but is denying that to me.  She denies homicidal ideation.  She denies hallucinations.  She does admit to smoking marijuana but denies other drug use.  She denies crying spells, early morning wakening, anhedonia.  Past Medical History:  Diagnosis Date  . Anxiety   . Depression   . Heart murmur   . Seizures [facility](HCC)     Patient Active Problem List   Diagnosis Date Noted  . Schizoaffective disorder, bipolar type (HCC) 09/27/2016  . Chest pain 07/19/2014  . Systolic murmur 07/19/2014    Past Surgical History:  Procedure Laterality Date  . FRACTURE SURGERY       OB History   No obstetric history on file.      Home Medications    Prior to Admission medications   Medication Sig Start Date End Date Taking? Authorizing Provider  famotidine (PEPCID) 20 MG tablet Take 1 tablet (20 mg total) by mouth at bedtime. Patient not taking: Reported on 10/21/2018 08/17/17   Ofilia Neaslark, Michael L, PA-C  pantoprazole (PROTONIX) 40 MG tablet Take 1 tablet (40 mg total) by mouth daily. Take thirty minutes before the first meal of the day. Patient not taking: Reported on 10/21/2018 08/17/17   Ofilia Neaslark, Michael L, PA-C     Family History Family History  Problem Relation Age of Onset  . Depression Mother   . Alcoholism Father     Social History Social History   Tobacco Use  . Smoking status: Current Every Day Smoker    Packs/day: 0.25  . Smokeless tobacco: Never Used  Substance Use Topics  . Alcohol use: No  . Drug use: Yes    Types: Marijuana     Allergies   Patient has no known allergies.   Review of Systems Review of Systems  All other systems reviewed and are negative.    Physical Exam Updated Vital Signs BP 129/77 (BP Location: Right Arm)   Pulse 71   Temp 98.5 F (36.9 C) (Oral)   Resp 16   SpO2 97%   Physical Exam Vitals signs and nursing note reviewed.    42 year old female, resting comfortably and in no acute distress. Vital signs are normal. Oxygen saturation is 97%, which is normal. Head is normocephalic and atraumatic. PERRLA, EOMI. Oropharynx is clear. Neck is nontender and supple without adenopathy or JVD. Back is nontender and there is no CVA tenderness. Lungs are clear without rales, wheezes, or rhonchi. Chest is nontender. Heart has regular rate and rhythm without murmur. Abdomen is soft, flat, nontender without masses or hepatosplenomegaly and peristalsis is normoactive. Extremities have no cyanosis or edema,  full range of motion is present. Skin is warm and dry without rash. Neurologic: Awake and alert and oriented, no depressed affect-in fact, seems perhaps slightly hypomanic, cranial nerves are intact, there are no motor or sensory deficits.  ED Treatments / Results  Labs (all labs ordered are listed, but only abnormal results are displayed) Labs Reviewed  COMPREHENSIVE METABOLIC PANEL - Abnormal; Notable for the following components:      Result Value   Glucose, Bld 112 (*)    Alkaline Phosphatase 31 (*)    All other components within normal limits  ACETAMINOPHEN LEVEL - Abnormal; Notable for the following components:   Acetaminophen  (Tylenol), Serum <10 (*)    All other components within normal limits  CBC - Abnormal; Notable for the following components:   WBC 12.7 (*)    All other components within normal limits  RAPID URINE DRUG SCREEN, HOSP PERFORMED - Abnormal; Notable for the following components:   Tetrahydrocannabinol POSITIVE (*)    All other components within normal limits  ETHANOL  SALICYLATE LEVEL  I-STAT BETA HCG BLOOD, ED ([facility], [facility], [facility] ONLY)  CBG MONITORING, ED  I-STAT BETA HCG BLOOD, ED ([facility], [facility], [facility] ONLY)    EKG EKG Interpretation  Date/Time:  Tuesday October 21 2018 01:45:16 EST Ventricular Rate:  70 PR Interval:    QRS Duration: 75 QT Interval:  404 QTC Calculation: 436 R Axis:   62 Text Interpretation:  Sinus rhythm Abnormal R-wave progression, early transition Baseline wander in lead(s) V1 When compared with ECG of 09/27/2016, No significant change was found Confirmed by Dione BoozeGlick, Lorisa Scheid ([id number]) on 10/21/2018 1:51:20 AM  Procedures Procedures   Medications Ordered in ED Medications  nicotine (NICODERM CQ - dosed in mg/24 hr) patch 7 mg (has no administration in time range)  acetaminophen (TYLENOL) tablet 650 mg (650 mg Oral Given 10/21/18 0624)  alum & mag hydroxide-simeth (MAALOX/MYLANTA) 200-200-20 MG/5ML suspension 30 mL (has no administration in time range)  ondansetron (ZOFRAN) tablet 4 mg (4 mg Oral Given 10/21/18 [id number])     Initial Impression / Assessment and Plan / ED Course  I have reviewed the triage vital signs and the nursing notes.  Pertinent lab results that were available during my care of the patient were reviewed by me and considered in my medical decision making (see chart for details).  Nontoxic ingestion with possible suicidal intent.  Patient appears hypomanic on my exam.  Her [facility] who has cleared her from their standpoint.  Screening labs are significant for drug screen positive for THC, consistent with her  history.  Old records are reviewed, and she does have prior ED visits and hospitalizations for psychiatric illness.  TTS consultation will be requested.  TTS consultation is appreciated.  Patient meets inpatient criteria and will be admitted to behavioral health Hospital when bed becomes available later this morning.  Final Clinical Impressions(s) / ED Diagnoses   Final diagnoses:  Suicidal ideation    ED Discharge Orders    None       Dione BoozeGlick, Chistina Roston, MD 10/21/18 [phone]

## 2018-10-21 NOTE — ED Triage Notes (Signed)
Pt reports that she was brought here because "her boss called the cops." She endorses taking 6-10 gabapentin, 1-2 lamictal, 1 advil PM, 2 regular advil, and drinking 2 glasses of wine. She states that she is experiencing a build up of stress and everything hit her tonight, causing this behavior. She does report SI.

## 2018-10-22 LAB — LIPID PANEL
Cholesterol: 151 mg/dL (ref 0–200)
HDL: 52 mg/dL (ref >40)
LDL CALC: 82 mg/dL (ref 0–99)
Total CHOL/HDL Ratio: 2.9 RATIO
Triglycerides: 86 mg/dL (ref <150)
VLDL: 17 mg/dL (ref 0–40)

## 2018-10-22 LAB — TSH: TSH: 0.671 u[IU]/mL (ref 0.350–4.500)

## 2018-10-22 MED ORDER — GABAPENTIN 100 MG PO CAPS
100.0000 mg | ORAL_CAPSULE | Freq: Three times a day (TID) | ORAL | Status: DC
  Administered 2018-10-22 – 2018-10-24 (×7): 100 mg via ORAL
  Filled 2018-10-22: qty 21
  Filled 2018-10-22 (×6): qty 1
  Filled 2018-10-22: qty 21
  Filled 2018-10-22: qty 1
  Filled 2018-10-22: qty 21
  Filled 2018-10-22 (×2): qty 1

## 2018-10-22 NOTE — BHH Group Notes (Signed)
Adult Psychoeducational Group Note  Date:  10/22/2018  Time: 9:45 am  Group Topic/Focus: Identifying Needs  Participation Level:  Active  Participation Quality:  Appropriate and Attentive  Affect:  Appropriate  Cognitive:  Alert and Oriented  Insight: Improving  Engagement in Group:  Developing/Improving  Modes of Intervention:  Discussion and Education  Additional Comments:  Patient identified which daily activities brought her pleasure and did not bring her pleasure to evaluate the balance in her life. Patient discussed stressors including work and coping skills such as talking to a good friend.  Anita Ball 10/22/2018 10:30 AM

## 2018-10-22 NOTE — Progress Notes (Signed)
Patient ID: Anita Ball, female   DOB: [birth year], 42 y.o.   MRN: [id number]  Nursing Progress Note [id number]  Data: On initial approach, patient is seen up in the dayroom interacting with peers. Patient presents calm, pleasant and cooperative. Patient does appear mildly anxious and fixated on discharge stating, "this isn't helping me. I'm here because I have a big mouth". Patient compliant with scheduled medications. Patient denies pain/physical complaints. Patient completed self-inventory sheet and rates depression, hopelessness, and anxiety 3,0,3 respectively. Patient rates their sleep and appetite as good/fair respectively. Patient states goal for today is to "be patient with getting out of this place". Patient is seen attending groups and visible in the milieu. Patient currently denies SI/HI/AVH.   Action: Patient is educated about and provided medication per provider's orders. Patient safety maintained with q15 min safety checks and frequent rounding. Low fall risk precautions in place. Emotional support given. 1:1 interaction and active listening provided. Patient encouraged to attend meals, groups, and work on treatment plan and goals. Labs, vital signs and patient behavior monitored throughout shift.   Response: Patient remains safe on the unit at this time and agrees to come to staff with any issues/concerns. Patient is interacting with peers appropriately on the unit. Will continue to support and monitor.

## 2018-10-22 NOTE — Plan of Care (Signed)
  Problem: Education: Goal: Knowledge of [facility] Education information/materials will improve Outcome: Progressing Goal: Verbalization of understanding the information provided will improve Outcome: Progressing   Problem: Activity: Goal: Interest or engagement in activities will improve Outcome: Progressing Goal: Sleeping patterns will improve Outcome: Progressing   Problem: Coping: Goal: Ability to demonstrate self-control will improve Outcome: Progressing   Problem: Health Behavior/Discharge Planning: Goal: Compliance with treatment plan for underlying cause of condition will improve Outcome: Progressing   Problem: Safety: Goal: Periods of time without injury will increase Outcome: Progressing

## 2018-10-22 NOTE — Progress Notes (Signed)
[facility] MD Progress Note  10/22/2018 9:44 AM Anita Ball  MRN:  [id number] Subjective: Patient reports "I feel okay".  Ruminates about being on an inpatient setting during the holiday.  States she feels job-related stress contributed to recent decompensation and presents future oriented expressing interest in finding another job. Today denies suicidal ideations. Currently denies any medication side effects. Objective: I have discussed case with treatment team and have met with patient. 42 year old female, admitted under IVC following overdose on Gabapentin and Lamictal, which had been prescribed to her in the past, but which she was not taking. ( Was not taking any psychiatric medications prior to admission). Reports overdose was impulsive, unplanned . Reports some recent depression and being under a lot of stress related to job and financial stressors, but does not endorse severe neuro-vegetative symptoms or recent SI. Today reports feeling "okay".  She ruminates about being inpatient, and states "it is boring".  She does present calmer and less pressured/less labile today.  Affect is reactive, no longer tearful.  Denies any suicidal ideations.  Tends to minimize events leading to admission/overdose.  Presents future oriented and expresses interest in relocating to [place] in the near future. Currently denies medication side effects. Reports she slept well last night. Labs reviewed-lipid panel unremarkable, TSH within normal limits  Principal Problem: S/P Overdose  Diagnosis: Active Problems:   Schizoaffective disorder, bipolar type (HCC)  Total Time spent with patient: 20 minutes  Past Psychiatric History:  Past Medical History:  Past Medical History:  Diagnosis Date  . Anxiety   . Depression   . Heart murmur   . Seizures ([place])     Past Surgical History:  Procedure Laterality Date  . FRACTURE SURGERY     Family History:  Family History  Problem Relation Age of Onset  .  Depression Mother   . Alcoholism Father    Family Psychiatric  History:  Social History:  Social History   Substance and Sexual Activity  Alcohol Use Yes     Social History   Substance and Sexual Activity  Drug Use Yes  . Types: Marijuana    Social History   Socioeconomic History  . Marital status: Widowed    Spouse name: Not on file  . Number of children: Not on file  . Years of education: Not on file  . Highest education level: Not on file  Occupational History  . Not on file  Social Needs  . Financial resource strain: Not on file  . Food insecurity:    Worry: Not on file    Inability: Not on file  . Transportation needs:    Medical: Not on file    Non-medical: Not on file  Tobacco Use  . Smoking status: Current Every Day Smoker    Packs/day: 0.25  . Smokeless tobacco: Never Used  Substance and Sexual Activity  . Alcohol use: Yes  . Drug use: Yes    Types: Marijuana  . Sexual activity: Yes  Lifestyle  . Physical activity:    Days per week: Not on file    Minutes per session: Not on file  . Stress: Not on file  Relationships  . Social connections:    Talks on phone: Not on file    Gets together: Not on file    Attends religious service: Not on file    Active member of club or organization: Not on file    Attends meetings of clubs or organizations: Not on file  Relationship status: Not on file  Other Topics Concern  . Not on file  Social History Narrative  . Not on file   Additional Social History:   Sleep: improved   Appetite:  improved   Current Medications: Current Facility-Administered Medications  Medication Dose Route Frequency Provider Last Rate Last Dose  . acetaminophen (TYLENOL) tablet 650 mg  650 mg Oral Q6H PRN Lindon Romp A, NP      . alum & mag hydroxide-simeth (MAALOX/MYLANTA) 200-200-20 MG/5ML suspension 30 mL  30 mL Oral Q4H PRN Lindon Romp A, NP      . asenapine (SAPHRIS) sublingual tablet 5 mg  5 mg Sublingual BID Cobos,  Myer Peer, MD   5 mg at 10/22/18 0859  . gabapentin (NEURONTIN) capsule 100 mg  100 mg Oral BID Cobos, Myer Peer, MD   100 mg at 10/22/18 8032  . hydrOXYzine (ATARAX/VISTARIL) tablet 25 mg  25 mg Oral TID PRN Rozetta Nunnery, NP      . magnesium hydroxide (MILK OF MAGNESIA) suspension 30 mL  30 mL Oral Daily PRN Lindon Romp A, NP      . nicotine (NICODERM CQ - dosed in mg/24 hr) patch 7 mg  7 mg Transdermal Daily Lindon Romp A, NP   7 mg at 10/22/18 0859  . ondansetron (ZOFRAN) tablet 4 mg  4 mg Oral Q8H PRN Rozetta Nunnery, NP      . traZODone (DESYREL) tablet 50 mg  50 mg Oral QHS PRN Rozetta Nunnery, NP        Lab Results:  Results for orders placed or performed during the hospital encounter of 10/21/18 (from the past 48 hour(s))  Lipid panel     Status: None   Collection Time: 10/22/18  6:41 AM  Result Value Ref Range   Cholesterol 151 0 - 200 mg/dL   Triglycerides 86 <150 mg/dL   HDL 52 >40 mg/dL   Total CHOL/HDL Ratio 2.9 RATIO   VLDL 17 0 - 40 mg/dL   LDL Cholesterol 82 0 - 99 mg/dL    Comment:        Total Cholesterol/HDL:CHD Risk Coronary Heart Disease Risk Table                     Men   Women  1/2 Average Risk   3.4   3.3  Average Risk       5.0   4.4  2 X Average Risk   9.6   7.1  3 X Average Risk  23.4   11.0        Use the calculated Patient Ratio above and the CHD Risk Table to determine the patient's CHD Risk.        ATP III CLASSIFICATION (LDL):  <100     mg/dL   Optimal  100-129  mg/dL   Near or Above                    Optimal  130-159  mg/dL   Borderline  160-189  mg/dL   High  >190     mg/dL   Very High Performed at [facility] [address], [place], [place] [address]   TSH     Status: None   Collection Time: 10/22/18  6:41 AM  Result Value Ref Range   TSH 0.671 0.350 - 4.500 uIU/mL    Comment: Performed by a 3rd Generation assay with a functional sensitivity of <=0.01 uIU/mL.  Performed at [facility], [place] [address], [place], [place] [address]     Blood Alcohol level:  Lab Results  Component Value Date   [facility] <10 10/21/2018   ETH <5 00/93/8182    Metabolic Disorder Labs: Lab Results  Component Value Date   HGBA1C 5.2 09/28/2016   MPG 103 09/28/2016   Lab Results  Component Value Date   PROLACTIN 20.4 09/28/2016   Lab Results  Component Value Date   CHOL 151 10/22/2018   TRIG 86 10/22/2018   HDL 52 10/22/2018   CHOLHDL 2.9 10/22/2018   VLDL 17 10/22/2018   LDLCALC 82 10/22/2018   LDLCALC 65 09/28/2016    Physical Findings: AIMS: Facial and Oral Movements Muscles of Facial Expression: None, normal Lips and Perioral Area: None, normal Jaw: None, normal Tongue: None, normal,Extremity Movements Upper (arms, wrists, hands, fingers): None, normal Lower (legs, knees, ankles, toes): None, normal, Trunk Movements Neck, shoulders, hips: None, normal, Overall Severity Severity of abnormal movements (highest score from questions above): None, normal Incapacitation due to abnormal movements: None, normal Patient's awareness of abnormal movements (rate only patient's report): No Awareness, Dental Status Current problems with teeth and/or dentures?: No Does patient usually wear dentures?: No  CIWA:    COWS:     Musculoskeletal: Strength & Muscle Tone: within normal limits Gait & Station: normal Patient leans: N/A  Psychiatric Specialty Exam: Physical Exam  ROS denies chest pain, no shortness of breath, no vomiting   Blood pressure 102/72, pulse 63, temperature 98.2 F (36.8 C), temperature source Oral, resp. rate 16, height _0  (1.6 m), weight 85.3 kg, SpO2 100 %.Body mass index is 33.3 kg/m.  General Appearance: improving grooming   Eye Contact:  Good  Speech:  Normal Rate- not pressured today  Volume:  Normal  Mood:  reports feeling"okay" , denies depression  Affect:  improving, less labile, not tearful today  Thought Process:  Linear and Descriptions of  Associations: Intact  Orientation:  Other:  fully alert and attentive  Thought Content:  denies hallucinations, does not appear internally preoccupied, no delusions expressed at this time  Suicidal Thoughts:  No denies suicidal or self injurious ideations, denies homicidal or violent ideations, contracts for safety  Homicidal Thoughts:  No denies   Memory:  recent and remote grossly intact   Judgement:  Fair  Insight:  Fair  Psychomotor Activity:  calm, no psychomotor agitation at this time  Concentration:  Concentration: Fair and Attention Span: Fair  Recall:  Good  Fund of Knowledge:  Good  Language:  Good  Akathisia:  Negative  Handed:  Right  AIMS (if indicated):     Assets:  Desire for Improvement Resilience  ADL's:  Intact  Cognition:  WNL  Sleep:  Number of Hours: 6.75   Assessment - 42 year old female, admitted under IVC following overdose on Gabapentin and Lamictal, which had been prescribed to her in the past, but which she was not taking. ( Was not taking any psychiatric medications prior to admission). Reports overdose was impulsive, unplanned . Reports some recent depression and being under a lot of stress related to job and financial stressors, but does not endorse severe neuro-vegetative symptoms or recent SI. Patient minimizes/denies depression at this time, characterizes mood as "okay".  She presents less labile today, affect improving, not tearful today, speech improved/less pressured.  Denies suicidal ideations. She reports a prior history of good response to Saphris and is tolerating this medication well thus far.  Treatment Plan Summary: Daily contact with patient to assess and evaluate symptoms and progress in treatment, Medication management, Plan Inpatient treatment and Medications as below Encourage group and milieu participation to work on coping skills and symptom reduction Treatment team working on disposition planning options Continue Saphris 5 mg  sublingual twice daily for mood disorder Increase Neurontin to 100 mg 3 times daily for anxiety Continue Trazodone 50 mg nightly PRN for insomnia as needed Continue Vistaril 25 mg every 8 hours PRN for anxiety as needed  Jenne Campus, MD 10/22/2018, 9:44 AM

## 2018-10-22 NOTE — Progress Notes (Signed)
Patient ID: Anita HarmanKatherine L Ball, female   DOB: [birth year], 42 y.o.   MRN: [id number] DAR Note: Pt has been hyperverbal and very restless on the unit. Pt continue to minimize situation, "I don't need to be here; there is nothing the doctor or you (the nurse) can say that I don't already know; I just took a few pills because I wasn't thinking, that's all." Pt denied any anxiety, depression, SI, HI or pain at the time of assessment; "I know I don't need to be here, there is nothing going on with me." Pt was reluctant to comply with medication. All patient's questions and concerns addressed. Support, encouragement, and safe environment provided. Will continue to monitor for any changes. 15-minute safety checks continue.

## 2018-10-23 LAB — HEMOGLOBIN A1C
Hgb A1c MFr Bld: 5.3 % (ref 4.8–5.6)
MEAN PLASMA GLUCOSE: 105 mg/dL

## 2018-10-23 MED ORDER — ASENAPINE MALEATE 5 MG SL SUBL
5.0000 mg | SUBLINGUAL_TABLET | Freq: Two times a day (BID) | SUBLINGUAL | Status: DC
  Administered 2018-10-23 – 2018-10-24 (×2): 5 mg via SUBLINGUAL
  Filled 2018-10-23: qty 14
  Filled 2018-10-23: qty 1
  Filled 2018-10-23: qty 14
  Filled 2018-10-23 (×3): qty 1

## 2018-10-23 NOTE — Progress Notes (Signed)
Patient ID: Anita Ball, female   DOB: 11-30-1975, 42 y.o.   MRN: [id number]  Nursing Progress Note [id number]  Data: On initial approach, patient was resting in bed. Patient did get up for breakfast and medications but reports her Saphris "might be making me sleepy". Patient did return to bed and has been resting in no acute distress. Patient remains fixated on discharge and appears to minimize her reason for admission. Patient presents with pressured speech but is pleasant with Clinical research associatewriter. Patient compliant with scheduled medications. Patient denies pain/physical complaints. Patient provided but has not yet completed their self-inventory sheet. Patient has been attending groups and visible in the milieu. Patient currently denies SI/HI/AVH.   Action: Patient is educated about and provided medication per provider's orders. Patient safety maintained with q15 min safety checks and frequent rounding. Low fall risk precautions in place. Emotional support given. 1:1 interaction and active listening provided. Patient encouraged to attend meals, groups, and work on treatment plan and goals. Labs, vital signs and patient behavior monitored throughout shift.   Response: Patient remains safe on the unit at this time and agrees to come to staff with any issues/concerns. Patient is interacting with peers appropriately on the unit. Will continue to support and monitor.

## 2018-10-23 NOTE — Progress Notes (Signed)
Adult Psychoeducational Group Note  Date:  10/23/2018 Time:  10:29 PM  Group Topic/Focus:  Wrap-Up Group:   The focus of this group is to help patients review their daily goal of treatment and discuss progress on daily workbooks.  Participation Level:  Active  Participation Quality:  Appropriate  Affect:  Appropriate  Cognitive:  Alert and Appropriate  Insight: Appropriate and Good  Engagement in Group:  Engaged  Modes of Intervention:  Discussion  Additional Comments:  Pt said her day was a 7 the one positive thing that happened today she was able to meet friend  Chauncey FischerRobinson, Anastacio Bua Long 10/23/2018, 10:29 PM

## 2018-10-23 NOTE — Progress Notes (Addendum)
[facility] MD Progress Note  10/23/2018 10:51 AM Anita Ball  MRN:  [id number] Subjective:  "I'm ok. I think the Saphris is making me sleepy but it does help me feel calm"  Anita Ball found resting in bed on assessment today. Reports some somnolence since starting the Saphris but says it does help with mood stability. She is eager for discharge. Denies current SI, HI, AVH.  Reports high stress levels at her job contributed to this hospital admission. She became angry at work and quit her job impulsively, then overdosed at home and called her boss. Regarding overdose - "Every now and then I get tired of everything. I think I was being dramatic and trying to scare her [the boss]. I didn't mean it. It was dumb." She denies suicidal intent behind overdose. She reports history of problems with acting impulsively when she get angry- "I need to work on reactions to things I can't control." Also states intent to look for new job when she discharges.   From admission H&P: 42 year old female. Presented to ED/ admitted under IVC  following ingestion of prescribed Lamictal and Gabapentin, alcohol. She reports she took " about 6 or 7 pills". ( on 12/24) . She states overdose was impulsive and unplanned . She states " I was not even suicidal , I just wanted to rest, not think".  She states she vomited soon after ingesting medication. States she had also consumed some wine prior to to overdose , but denies any pattern of alcohol abuse or dependence, states she drinks about once a month .  She states that after overdose she contacted her employer who then called police . Patient reports she has been depressed over recent days, but does not characterize as severe. She states " it is more like I have been stressed out". She reports  contributing psychosocial stressors, mainly  tension at work, feels bullied at work, financial concerns,with fear her home may go into foreclosure, and conflict with her daughter. Admission  UDS positive for Cannabis, BAL negative. She endorses some neuro-vegetative symptoms, but does not endorse changes in sleep, energy , or appetite. She reports she has not been taking psychiatric medications for years . She reports medications she overdosed on ( Lamictal, Gabapentin) had been prescribed in the past but was not taking them regularly .  Principal Problem: Schizoaffective disorder, bipolar type (HCC) Diagnosis: Principal Problem:   Schizoaffective disorder, bipolar type (HCC)  Total Time spent with patient: 15 minutes  Past Psychiatric History: See admission H&P  Past Medical History:  Past Medical History:  Diagnosis Date  . Anxiety   . Depression   . Heart murmur   . Seizures (HCC)     Past Surgical History:  Procedure Laterality Date  . FRACTURE SURGERY     Family History:  Family History  Problem Relation Age of Onset  . Depression Mother   . Alcoholism Father    Family Psychiatric  History: See admission H&P Social History:  Social History   Substance and Sexual Activity  Alcohol Use Yes     Social History   Substance and Sexual Activity  Drug Use Yes  . Types: Marijuana    Social History   Socioeconomic History  . Marital status: Widowed    Spouse name: Not on file  . Number of children: Not on file  . Years of education: Not on file  . Highest education level: Not on file  Occupational History  . Not on file  Social Needs  . Financial resource strain: Not on file  . Food insecurity:    Worry: Not on file    Inability: Not on file  . Transportation needs:    Medical: Not on file    Non-medical: Not on file  Tobacco Use  . Smoking status: Current Every Day Smoker    Packs/day: 0.25  . Smokeless tobacco: Never Used  Substance and Sexual Activity  . Alcohol use: Yes  . Drug use: Yes    Types: Marijuana  . Sexual activity: Yes  Lifestyle  . Physical activity:    Days per week: Not on file    Minutes per session: Not on file  .  Stress: Not on file  Relationships  . Social connections:    Talks on phone: Not on file    Gets together: Not on file    Attends religious service: Not on file    Active member of club or organization: Not on file    Attends meetings of clubs or organizations: Not on file    Relationship status: Not on file  Other Topics Concern  . Not on file  Social History Narrative  . Not on file   Additional Social History:       Sleep: Good  Appetite:  Good  Current Medications: Current Facility-Administered Medications  Medication Dose Route Frequency Provider Last Rate Last Dose  . acetaminophen (TYLENOL) tablet 650 mg  650 mg Oral Q6H PRN Nira ConnBerry, Jason A, NP      . alum & mag hydroxide-simeth (MAALOX/MYLANTA) 200-200-20 MG/5ML suspension 30 mL  30 mL Oral Q4H PRN Nira ConnBerry, Jason A, NP      . asenapine (SAPHRIS) sublingual tablet 5 mg  5 mg Sublingual BID Cobos, Rockey SituFernando A, MD   5 mg at 10/23/18 0746  . gabapentin (NEURONTIN) capsule 100 mg  100 mg Oral TID Cobos, Rockey SituFernando A, MD   100 mg at 10/23/18 0746  . hydrOXYzine (ATARAX/VISTARIL) tablet 25 mg  25 mg Oral TID PRN Nira ConnBerry, Jason A, NP      . magnesium hydroxide (MILK OF MAGNESIA) suspension 30 mL  30 mL Oral Daily PRN Nira ConnBerry, Jason A, NP      . nicotine (NICODERM CQ - dosed in mg/24 hr) patch 7 mg  7 mg Transdermal Daily Nira ConnBerry, Jason A, NP   7 mg at 10/23/18 0747  . ondansetron (ZOFRAN) tablet 4 mg  4 mg Oral Q8H PRN Jackelyn PolingBerry, Jason A, NP      . traZODone (DESYREL) tablet 50 mg  50 mg Oral QHS PRN Jackelyn PolingBerry, Jason A, NP        Lab Results:  Results for orders placed or performed during the hospital encounter of 10/21/18 (from the past 48 hour(s))  Hemoglobin A1c     Status: None   Collection Time: 10/22/18  6:41 AM  Result Value Ref Range   Hgb A1c MFr Bld 5.3 4.8 - 5.6 %    Comment: (NOTE)         Prediabetes: 5.7 - 6.4         Diabetes: >6.4         Glycemic control for adults with diabetes: <7.0    Mean Plasma Glucose 105 mg/dL     Comment: (NOTE) Performed At: [facility] [address] [place], [place] [id number] Jolene SchimkeNagendra Sanjai MD WU:[id number]:[phone]   Lipid panel     Status: None   Collection Time: 10/22/18  6:41 AM  Result Value Ref Range  Cholesterol 151 0 - 200 mg/dL   Triglycerides 86 <865<150 mg/dL   HDL 52 >78>40 mg/dL   Total CHOL/HDL Ratio 2.9 RATIO   VLDL 17 0 - 40 mg/dL   LDL Cholesterol 82 0 - 99 mg/dL    Comment:        Total Cholesterol/HDL:CHD Risk Coronary Heart Disease Risk Table                     Men   Women  1/2 Average Risk   3.4   3.3  Average Risk       5.0   4.4  2 X Average Risk   9.6   7.1  3 X Average Risk  23.4   11.0        Use the calculated Patient Ratio above and the CHD Risk Table to determine the patient's CHD Risk.        ATP III CLASSIFICATION (LDL):  <100     mg/dL   Optimal  [id number]  mg/dL   Near or Above                    Optimal  130-159  mg/dL   Borderline  [id number]  mg/dL   High  >244>190     mg/dL   Very High Performed at [facility], [address] [place], [place] [address]   TSH     Status: None   Collection Time: 10/22/18  6:41 AM  Result Value Ref Range   TSH 0.671 0.350 - 4.500 uIU/mL    Comment: Performed by a 3rd Generation assay with a functional sensitivity of <=0.01 uIU/mL. Performed at [facility], [address] [place], [place] [address]     Blood Alcohol level:  Lab Results  Component Value Date   ETH <10 10/21/2018   ETH <5 09/25/2016    Metabolic Disorder Labs: Lab Results  Component Value Date   HGBA1C 5.3 10/22/2018   MPG 105 10/22/2018   MPG 103 09/28/2016   Lab Results  Component Value Date   PROLACTIN 20.4 09/28/2016   Lab Results  Component Value Date   CHOL 151 10/22/2018   TRIG 86 10/22/2018   HDL 52 10/22/2018   CHOLHDL 2.9 10/22/2018   VLDL 17 10/22/2018   LDLCALC 82 10/22/2018   LDLCALC 65 09/28/2016    Physical Findings: AIMS: Facial and Oral Movements Muscles  of Facial Expression: None, normal Lips and Perioral Area: None, normal Jaw: None, normal Tongue: None, normal,Extremity Movements Upper (arms, wrists, hands, fingers): None, normal Lower (legs, knees, ankles, toes): None, normal, Trunk Movements Neck, shoulders, hips: None, normal, Overall Severity Severity of abnormal movements (highest score from questions above): None, normal Incapacitation due to abnormal movements: None, normal Patient's awareness of abnormal movements (rate only patient's report): No Awareness, Dental Status Current problems with teeth and/or dentures?: No Does patient usually wear dentures?: No  CIWA:    COWS:     Musculoskeletal: Strength & Muscle Tone: within normal limits Gait & Station: normal Patient leans: N/A  Psychiatric Specialty Exam: Physical Exam  Nursing note and vitals reviewed. Constitutional: She is oriented to person, place, and time. She appears well-developed.  HENT:  Head: Normocephalic.  Neck: Normal range of motion.  Respiratory: Effort normal.  Musculoskeletal: Normal range of motion.  Neurological: She is alert and oriented to person, place, and time.    Review of Systems  Psychiatric/Behavioral: Positive for substance abuse (UDS +  THC). Negative for depression, hallucinations, memory loss and suicidal ideas. The patient is not nervous/anxious and does not have insomnia.     Blood pressure 126/84, pulse 74, temperature 97.9 F (36.6 C), temperature source Oral, resp. rate 16, height 5\' 3"  (1.6 m), weight 85.3 kg, SpO2 100 %.Body mass index is 33.3 kg/m.  General Appearance: Casual  Eye Contact:  Good  Speech:  Pressured  Volume:  Normal  Mood:  Euthymic  Affect:  Appropriate  Thought Process:  Coherent  Orientation:  Full (Time, Place, and Person)  Thought Content:  Logical  Suicidal Thoughts:  No  Homicidal Thoughts:  No  Memory:  Immediate;   Good  Judgement:  Fair  Insight:  Fair  Psychomotor Activity:  Normal   Concentration:  Concentration: Good  Recall:  Good  Fund of Knowledge:  Good  Language:  Good  Akathisia:  No  Handed:  Right  AIMS (if indicated):     Assets:  Communication Skills Desire for Improvement Housing Physical Health  ADL's:  Intact  Cognition:  WNL  Sleep:  Number of Hours: 6.75     Treatment Plan Summary: Daily contact with patient to assess and evaluate symptoms and progress in treatment and Medication management  Continue inpatient hospitalization.  Continue current treatment plan as listed below:  Mood: Continue Saphris 5 mg SL BID  Agitation/anxiety: Continue gabapentin 100 mg PO TID Continue Vistaril 25 mg PO TID PRN anxiety  Nicotine dependence: Continue nicotine 7 mg patch transdermal daily  Patient will participate in the therapeutic group milieu.  Discharge disposition in progress.   Aldean Baker, NP 10/23/2018, 10:51 AM   ..Agree with NP Progress Note

## 2018-10-23 NOTE — Tx Team (Signed)
Interdisciplinary Treatment and Diagnostic Plan Update  10/23/2018 Time of Session: 0830AM Anita HarmanKatherine L Ball MRN: [id number]  Principal Diagnosis: Schizoaffective disorder, bipolar type [facility](HCC)  Secondary Diagnoses: Principal Problem:   Schizoaffective disorder, bipolar type (HCC)   Current Medications:  Current Facility-Administered Medications  Medication Dose Route Frequency Provider Last Rate Last Dose  . acetaminophen (TYLENOL) tablet 650 mg  650 mg Oral Q6H PRN Nira ConnBerry, Jason A, NP      . alum & mag hydroxide-simeth (MAALOX/MYLANTA) 200-200-20 MG/5ML suspension 30 mL  30 mL Oral Q4H PRN Nira ConnBerry, Jason A, NP      . asenapine (SAPHRIS) sublingual tablet 5 mg  5 mg Sublingual BID Cobos, Rockey SituFernando A, MD   5 mg at 10/23/18 0746  . gabapentin (NEURONTIN) capsule 100 mg  100 mg Oral TID Cobos, Rockey SituFernando A, MD   100 mg at 10/23/18 1210  . hydrOXYzine (ATARAX/VISTARIL) tablet 25 mg  25 mg Oral TID PRN Nira ConnBerry, Jason A, NP      . magnesium hydroxide (MILK OF MAGNESIA) suspension 30 mL  30 mL Oral Daily PRN Nira ConnBerry, Jason A, NP      . nicotine (NICODERM CQ - dosed in mg/24 hr) patch 7 mg  7 mg Transdermal Daily Nira ConnBerry, Jason A, NP   7 mg at 10/23/18 0747  . ondansetron (ZOFRAN) tablet 4 mg  4 mg Oral Q8H PRN Nira ConnBerry, Jason A, NP      . traZODone (DESYREL) tablet 50 mg  50 mg Oral QHS PRN Nira ConnBerry, Jason A, NP       PTA Medications: No medications prior to admission.    Patient Stressors: Financial difficulties Medication change or noncompliance Occupational concerns  Patient Strengths: Ability for insight Average or above average intelligence Capable of independent living General fund of knowledge  Treatment Modalities: Medication Management, Group therapy, Case management,  1 to 1 session with clinician, Psychoeducation, Recreational therapy.   Physician Treatment Plan for Primary Diagnosis: Schizoaffective disorder, bipolar type (HCC) Long Term Goal(s): Improvement in symptoms so as ready for  discharge Improvement in symptoms so as ready for discharge   Short Term Goals: Ability to identify changes in lifestyle to reduce recurrence of condition will improve Ability to verbalize feelings will improve Ability to disclose and discuss suicidal ideas Ability to demonstrate self-control will improve Ability to identify and develop effective coping behaviors will improve Ability to maintain clinical measurements within normal limits will improve Ability to identify changes in lifestyle to reduce recurrence of condition will improve Ability to verbalize feelings will improve Ability to disclose and discuss suicidal ideas Ability to demonstrate self-control will improve Ability to identify and develop effective coping behaviors will improve Ability to maintain clinical measurements within normal limits will improve  Medication Management: Evaluate patient's response, side effects, and tolerance of medication regimen.  Therapeutic Interventions: 1 to 1 sessions, Unit Group sessions and Medication administration.  Evaluation of Outcomes: Adequate for Discharge  Physician Treatment Plan for Secondary Diagnosis: Principal Problem:   Schizoaffective disorder, bipolar type (HCC)  Long Term Goal(s): Improvement in symptoms so as ready for discharge Improvement in symptoms so as ready for discharge   Short Term Goals: Ability to identify changes in lifestyle to reduce recurrence of condition will improve Ability to verbalize feelings will improve Ability to disclose and discuss suicidal ideas Ability to demonstrate self-control will improve Ability to identify and develop effective coping behaviors will improve Ability to maintain clinical measurements within normal limits will improve Ability to identify changes in lifestyle to  reduce recurrence of condition will improve Ability to verbalize feelings will improve Ability to disclose and discuss suicidal ideas Ability to demonstrate  self-control will improve Ability to identify and develop effective coping behaviors will improve Ability to maintain clinical measurements within normal limits will improve     Medication Management: Evaluate patient's response, side effects, and tolerance of medication regimen.  Therapeutic Interventions: 1 to 1 sessions, Unit Group sessions and Medication administration.  Evaluation of Outcomes: Adequate for Discharge   RN Treatment Plan for Primary Diagnosis: Schizoaffective disorder, bipolar type (HCC) Long Term Goal(s): Knowledge of disease and therapeutic regimen to maintain health will improve  Short Term Goals: Ability to verbalize frustration and anger appropriately will improve, Ability to demonstrate self-control, Ability to verbalize feelings will improve and Ability to disclose and discuss suicidal ideas  Medication Management: RN will administer medications as ordered by provider, will assess and evaluate patient's response and provide education to patient for prescribed medication. RN will report any adverse and/or side effects to prescribing provider.  Therapeutic Interventions: 1 on 1 counseling sessions, Psychoeducation, Medication administration, Evaluate responses to treatment, Monitor vital signs and CBGs as ordered, Perform/monitor CIWA, COWS, AIMS and Fall Risk screenings as ordered, Perform wound care treatments as ordered.  Evaluation of Outcomes: Adequate for Discharge   LCSW Treatment Plan for Primary Diagnosis: Schizoaffective disorder, bipolar type (HCC) Long Term Goal(s): Safe transition to appropriate next level of care at discharge, Engage patient in therapeutic group addressing interpersonal concerns.  Short Term Goals: Engage patient in aftercare planning with referrals and resources, Increase emotional regulation, Facilitate patient progression through stages of change regarding substance use diagnoses and concerns and Increase skills for wellness and  recovery  Therapeutic Interventions: Assess for all discharge needs, 1 to 1 time with Social worker, Explore available resources and support systems, Assess for adequacy in community support network, Educate family and significant other(s) on suicide prevention, Complete Psychosocial Assessment, Interpersonal group therapy.  Evaluation of Outcomes: Adequate for Discharge   Progress in Treatment: Attending groups: Yes. Participating in groups: Yes. Taking medication as prescribed: Yes. Toleration medication: Yes. Family/Significant other contact made: SPE completed with pt; pt declined to consent to collateral contact.  Patient understands diagnosis: Yes. Discussing patient identified problems/goals with staff: Yes. Medical problems stabilized or resolved: Yes. Denies suicidal/homicidal ideation: Yes. Issues/concerns per patient self-inventory: No. Other: n/a   New problem(s) identified: No, Describe:  n/a  New Short Term/Long Term Goal(s):  medication management for mood stabilization; elimination of SI thoughts; development of comprehensive mental wellness/sobriety plan.   Patient Goals:  "I just want to go home."  Discharge Plan or Barriers: Pt is now agreeable to [facility] for [facility] follow-[facility]. (She did not want appt scheduled).  [facility] pamphlet, Mobile Crisis information, and AA/NA information provided to patient for additional community support and resources.   Reason for Continuation of Hospitalization: none  Estimated Length of Stay:  Today, 10/24/18  Attendees: Patient: [name] 10/23/2018 1:34 PM  Physician: Dr. Jama Flavorsobos MD 10/23/2018 1:34 PM  Nursing: Marchelle FolksAmanda RN 10/23/2018 1:34 PM  RN Care Manager:x 10/23/2018 1:34 PM  Social Worker: Corrie MckusickHeather Lodie Waheed LCSW 10/23/2018 1:34 PM  Recreational Therapist: x 10/23/2018 1:34 PM  Other: Armandina StammerAgnes Nwoko NP; Marciano SequinJanet Sykes NP 10/23/2018 1:34 PM  Other:  10/23/2018 1:34 PM  Other: 10/23/2018 1:34 PM     Scribe for Treatment Team: Rona RavensHeather S Elizzie Westergard, LCSW 10/23/2018 1:34 PM

## 2018-10-24 MED ORDER — ASENAPINE MALEATE 5 MG SL SUBL: 5.0000 mg | SUBLINGUAL_TABLET | Freq: Two times a day (BID) | SUBLINGUAL | 0 refills | Status: DC

## 2018-10-24 MED ORDER — GABAPENTIN 100 MG PO CAPS: 100.0000 mg | ORAL_CAPSULE | Freq: Three times a day (TID) | ORAL | 0 refills | Status: DC

## 2018-10-24 MED ORDER — NICOTINE 7 MG/24HR TD PT24: 7.0000 mg | MEDICATED_PATCH | Freq: Every day | TRANSDERMAL | 0 refills | Status: DC

## 2018-10-24 NOTE — Progress Notes (Signed)
Patient ID: Anita Ball, female   DOB: 05/09/1976, 42 y.o.   MRN: [id number]  Nursing Progress Note [id number]  Data: On initial approach, patient is seen up in the dayroom. Patient presents pleasant, calm and cooperative. Patient compliant with scheduled medications. Patient denies pain/physical complaints. Patient completed self-inventory sheet and rates depression, hopelessness, and anxiety 0,0,0 respectively. Patient rates their sleep and appetite as good/good respectively. Patient states goal for today is to "go home today". Patient is seen attending groups and visible in the milieu. Patient currently denies SI/HI/AVH.   Action: Patient is educated about and provided medication per provider's orders. Patient safety maintained with q15 min safety checks and frequent rounding. Low fall risk precautions in place. Emotional support given. 1:1 interaction and active listening provided. Patient encouraged to attend meals, groups, and work on treatment plan and goals. Labs, vital signs and patient behavior monitored throughout shift.   Response: Patient remains safe on the unit at this time and agrees to come to staff with any issues/concerns. Patient is interacting with peers appropriately on the unit. Will continue to support and monitor.

## 2018-10-24 NOTE — Plan of Care (Signed)
  Problem: Education: Goal: Knowledge of [facility] Education information/materials will improve Outcome: Progressing Goal: Verbalization of understanding the information provided will improve Outcome: Progressing   

## 2018-10-24 NOTE — Progress Notes (Signed)
D: Patient observed up and visible in the milieu. Patient states, "I've had a good day! I'm hoping to discharge soon. I'm feeling much better. I know some of what I did was inappropriate." Patient's affect animated, mood anxious, pleasant.   Denies pain, physical complaints.   A: Medicated per orders, no prns requested or required. Medication education provided particularly regarding need for NPO post admin of saphris. Level III obs in place for safety. Emotional support offered. Patient encouraged to complete Suicide Safety Plan before discharge. Encouraged to attend and participate in unit programming.    R: Patient verbalizes understanding of POC. Patient initially had difficulty going to sleep however after a few attempts was able to. Had offered prns however patient declined. Patient denies SI/HI/AVH and remains safe on level III obs. Will continue to monitor throughout the night.

## 2018-10-24 NOTE — BHH Suicide Risk Assessment (Signed)
[facility]   Principal Problem: Schizoaffective disorder, bipolar type [facility](HCC) Discharge Diagnoses: Principal Problem:   Schizoaffective disorder, bipolar type (HCC)   Total Time spent with patient: 30 minutes  Musculoskeletal: Strength & Muscle Tone: within normal limits Gait & Station: normal Patient leans: N/A  Psychiatric Specialty Exam: ROS no headache, no chest pain, no shortness of breath, no vomiting   Blood pressure 130/84, pulse 87, temperature 98.4 F (36.9 C), temperature source Oral, resp. rate 16, height 5\' 3"  (1.6 m), weight 85.3 kg, SpO2 100 %.Body mass index is 33.3 kg/m.  General Appearance: improving grooming   Eye Contact::  Good  Speech:  Normal Rate- not pressured or loud at this time  Volume:  Normal  Mood:  improving mood, reports she is feeling better.   Affect:  full range, but not expansive or irritable at this time   Thought Process:  Linear and Descriptions of Associations: Intact  Orientation:  Full (Time, Place, and Person)  Thought Content:  no hallucinations, no delusions, not internally preoccupied  Suicidal Thoughts:  No denies suicidal or self injurious ideations, no homicidal or violent ideations  Homicidal Thoughts:  No  Memory:  recent and remote grossly intact   Judgement:  Other:  improving   Insight:  improving   Psychomotor Activity:  Normal no psychomotor restlessness  Concentration:  Good  Recall:  Good  Fund of Knowledge:Good  Language: Good  Akathisia:  Negative  Handed:  Right  AIMS (if indicated):   no abnormal or involuntary movements noted or endorsed  Assets:  Communication Skills Desire for Improvement Resilience  Sleep:  Number of Hours: 6.75  Cognition: WNL  ADL's:  Intact   Mental Status Per Nursing Assessment::   On Admission:  NA  Demographic Factors:  Widowed, lives alone, has an adult daughter, employed   Loss Factors: Employment stressors, financial concerns  Historical  Factors: History of prior admission in 2017, has been diagnosed with Schizoaffective Disorder in the past. Denies history of suicide attempts   Risk Reduction Factors:   Employed and Positive coping skills or problem solving skills  Continued Clinical Symptoms:  At this time patient is alert, attentive, well related, mood improved and currently not overtly manic or hypomanic. Speech normal, not pressured, no psychomotor restlessness or agitation, no grandiose ideations, no flight of ideations, good sleep. Denies suicidal or self injurious ideations, denies homicidal or violent ideations. No hallucinations. No delusions expressed. 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 Currently future oriented, and states she plans to find a new job, as current job stressful, and possibly move to [place] next year. No disruptive or agitated behaviors on unit. Denies medication side effects other than mild sedation ( at this time fully alert and attentive) . Side effects discussed/reviewed, including risk of movement disorders, metabolic disorders, sedation, and advised not to drive if sedated.  Cognitive Features That Contribute To Risk:  No gross cognitive deficits noted upon discharge. Is alert , attentive, and oriented x 3   Suicide Risk:  Mild:  Suicidal ideation of limited frequency, intensity, duration, and specificity.  There are no identifiable plans, no associated intent, mild dysphoria and related symptoms, good self-control (both objective and subjective assessment), few other risk factors, and identifiable protective factors, including available and accessible social support.  Follow-up Information    Patient has declined all referrals. Follow up.  [facility]  Plan Of Care/Follow-up recommendations:  Activity:  as tolerated  Diet:   regular Tests:  NA Other:  See below  Patient is expressing readiness for discharge, and there are no current grounds for involuntary commitment. Patient is planning on returning home. At this time reports she is planning on establishing outpatient psychiatric treatment as above.  Craige CottaFernando A Cobos, MD 10/24/2018, 11:08 AM

## 2018-10-24 NOTE — Discharge Summary (Addendum)
Physician Discharge Summary Note  Patient:  Anita Ball is an 42 y.o., female MRN:  [id number] DOB:  1976/06/26 Patient phone:  [phone] (home)  Patient address:   [address] [place] [id number],  Total Time spent with patient: 15 minutes  Date of Admission:  10/21/2018 Date of Discharge: 10/24/2018  Reason for Admission:  Overdose on Lamictal, gabapentin, alcohol  Principal Problem: Schizoaffective disorder, bipolar type [facility](HCC) Discharge Diagnoses: Principal Problem:   Schizoaffective disorder, bipolar type [facility](HCC)   Past Psychiatric History: From admission H&P: one prior psychiatric admission in 2017. At the time she was admitted under IVC for manic symptoms and disorganized behavior. At the time was diagnosed with  Schizoaffective Disorder. At the time she was discharged on Saphris, Lamictal, Neurontin. Denies psychotic symptoms , but states she has had a " lifelong gift of being psychic", and has worked as a medium. Denies prior suicidal attempts, denies history of self cutting or of self injurious behaviors . Denies history of violence. Reports anxiety ( worry, ruminations) but not panic attacks, endorses mild agoraphobia.  Past Medical History:  Past Medical History:  Diagnosis Date  . Anxiety   . Depression   . Heart murmur   . Seizures (HCC)     Past Surgical History:  Procedure Laterality Date  . FRACTURE SURGERY     Family History:  Family History  Problem Relation Age of Onset  . Depression Mother   . Alcoholism Father    Family Psychiatric  History: From admission H&P: reports father has history of depression and alcohol abuse, now in remission. No suicides in family . Social History:  Social History   Substance and Sexual Activity  Alcohol Use Yes     Social History   Substance and Sexual Activity  Drug Use Yes  . Types: Marijuana    Social History   Socioeconomic History  . Marital status: Widowed    Spouse name: Not on file  .  Number of children: Not on file  . Years of education: Not on file  . Highest education level: Not on file  Occupational History  . Not on file  Social Needs  . Financial resource strain: Not on file  . Food insecurity:    Worry: Not on file    Inability: Not on file  . Transportation needs:    Medical: Not on file    Non-medical: Not on file  Tobacco Use  . Smoking status: Current Every Day Smoker    Packs/day: 0.25  . Smokeless tobacco: Never Used  Substance and Sexual Activity  . Alcohol use: Yes  . Drug use: Yes    Types: Marijuana  . Sexual activity: Yes  Lifestyle  . Physical activity:    Days per week: Not on file    Minutes per session: Not on file  . Stress: Not on file  Relationships  . Social connections:    Talks on phone: Not on file    Gets together: Not on file    Attends religious service: Not on file    Active member of club or organization: Not on file    Attends meetings of clubs or organizations: Not on file    Relationship status: Not on file  Other Topics Concern  . Not on file  Social History Narrative  . Not on file    Hospital Course:  From admission H&P: 42 year old female. Presented to ED/ admitted under IVC  following ingestion of prescribed Lamictal and  Gabapentin, alcohol. She reports she took " about 6 or 7 pills". ( on 12/24) . She states overdose was impulsive and unplanned . She states " I was not even suicidal , I just wanted to rest, not think".  She states she vomited soon after ingesting medication. States she had also consumed some wine prior to to overdose , but denies any pattern of alcohol abuse or dependence, states she drinks about once a month. She states that after overdose she contacted her employer who then called police. Patient reports she has been depressed over recent days, but does not characterize as severe. She states " it is more like I have been stressed out". She reports  contributing psychosocial stressors, mainly   tension at work, feels bullied at work, financial concerns,with fear her home may go into foreclosure, and conflict with her daughter. Admission UDS positive for Cannabis, BAL negative. She endorses some neuro-vegetative symptoms, but does not endorse changes in sleep, energy , or appetite. She reports she has not been taking psychiatric medications for years . She reports medications she overdosed on ( Lamictal, Gabapentin) had been prescribed in the past but was not taking them regularly.  [name] was admitted for an overdose on Lamictal, gabapentin, and alcohol. She reported recent stress and mood instability related to a difficult job. She was treated and discharged with Saphris and gabapentin. She also participated in group therapy on the unit to learn coping skills. She responded well to treatment with no adverse effects.   Improvement was monitored by observation and Ms. Pease 's daily report of symptom reduction.  Emotional and mental status was monitored by daily self-inventory reports completed by Ms. Murcia and Engineer, materials.         [name] was evaluated by the treatment team for stability and plans for continued recovery upon discharge. She will follow up with the services as listed below under Follow Up Information.     Upon completion of this admission the patient was both mentally and medically stable for discharge denying suicidal/homicidal ideation, auditory/visual/tactile hallucinations, delusional thoughts and paranoia.    Physical Findings: AIMS: Facial and Oral Movements Muscles of Facial Expression: None, normal Lips and Perioral Area: None, normal Jaw: None, normal Tongue: None, normal,Extremity Movements Upper (arms, wrists, hands, fingers): None, normal Lower (legs, knees, ankles, toes): None, normal, Trunk Movements Neck, shoulders, hips: None, normal, Overall Severity Severity of abnormal movements (highest score from questions above): None,  normal Incapacitation due to abnormal movements: None, normal Patient's awareness of abnormal movements (rate only patient's report): No Awareness, Dental Status Current problems with teeth and/or dentures?: No Does patient usually wear dentures?: No  CIWA:    COWS:     Musculoskeletal: Strength & Muscle Tone: within normal limits Gait & Station: normal Patient leans: N/A  Psychiatric Specialty Exam: Physical Exam  Nursing note and vitals reviewed. Constitutional: She is oriented to person, place, and time. She appears well-developed.  HENT:  Head: Normocephalic.  Respiratory: Effort normal.  Musculoskeletal: Normal range of motion.  Neurological: She is alert and oriented to person, place, and time.    Review of Systems  Respiratory: Negative for shortness of breath.   Cardiovascular: Negative for chest pain.  Gastrointestinal: Negative for vomiting.  Neurological: Negative for headaches.  Psychiatric/Behavioral: Positive for depression (Stable on medication) and substance abuse (Hx THC). Negative for hallucinations, memory loss and suicidal ideas. The patient is not nervous/anxious and does not have insomnia.     Blood pressure  130/84, pulse 87, temperature 98.4 F (36.9 C), temperature source Oral, resp. rate 16, height 5\' 3"  (1.6 m), weight 85.3 kg, SpO2 100 %.Body mass index is 33.3 kg/m.  See MD's discharge SRA     Have you used any form of tobacco in the last 30 days? (Cigarettes, Smokeless Tobacco, Cigars, and/or Pipes): Yes  Has this patient used any form of tobacco in the last 30 days? (Cigarettes, Smokeless Tobacco, Cigars, and/or Pipes) Yes, an FDA-approved tobacco cessation medication was offered at discharge.   Blood Alcohol level:  Lab Results  Component Value Date   ETH <10 10/21/2018   ETH <5 09/25/2016    Metabolic Disorder Labs:  Lab Results  Component Value Date   HGBA1C 5.3 10/22/2018   MPG 105 10/22/2018   MPG 103 09/28/2016   Lab Results   Component Value Date   PROLACTIN 20.4 09/28/2016   Lab Results  Component Value Date   CHOL 151 10/22/2018   TRIG 86 10/22/2018   HDL 52 10/22/2018   CHOLHDL 2.9 10/22/2018   VLDL 17 10/22/2018   LDLCALC 82 10/22/2018   LDLCALC 65 09/28/2016    See Psychiatric Specialty Exam and Suicide Risk Assessment completed by Attending Physician prior to discharge.  Discharge destination:  Home  Is patient on multiple antipsychotic therapies at discharge:  No   Has Patient had three or more failed trials of antipsychotic monotherapy by history:  No  Recommended Plan for Multiple Antipsychotic Therapies: NA  Discharge Instructions    Discharge instructions   Complete by:  As directed    Patient is instructed to take all prescribed medications as recommended. Report any side effects or adverse reactions to your outpatient psychiatrist. Patient is instructed to abstain from alcohol and illegal drugs while on prescription medications. In the event of worsening symptoms, patient is instructed to call the crisis hotline, 911, or go to the nearest emergency department for evaluation and treatment.     Allergies as of 10/24/2018   No Known Allergies     Medication List    TAKE these medications     Indication  asenapine 5 MG Subl 24 hr tablet Commonly known as:  SAPHRIS Place 1 tablet (5 mg total) under the tongue every 12 (twelve) hours. For mood  Indication:  Mood   gabapentin 100 MG capsule Commonly known as:  NEURONTIN Take 1 capsule (100 mg total) by mouth 3 (three) times daily. For anxiety/agitation  Indication:  Anxiety   nicotine 7 mg/24hr patch Commonly known as:  NICODERM CQ - dosed in mg/24 hr Place 1 patch (7 mg total) onto the skin daily. (May buy over the counter) Start taking on:  October 25, 2018  Indication:  Nicotine Addiction      Follow-up Information    [facility] Follow up.   Specialty:  Professional Counselor Why:  Please walk  in within 3 days of hospital discharge to be assessed for outpatient mental health services including: medication management; individual counseling, and group therapy/support groups if interested. Walk in hours: Mon-Fri 9am-12pm. Thank you.  Contact information: [address] [place] [address] [phone]           Follow-up recommendations: Activity as tolerated. Diet as recommended by primary care physician. Keep all scheduled follow-up appointments as recommended.   Comments:   Patient is instructed to take all prescribed medications as recommended. Report any side effects or adverse reactions to your outpatient psychiatrist. Patient is instructed to abstain  from alcohol and illegal drugs while on prescription medications. In the event of worsening symptoms, patient is instructed to call the crisis hotline, 911, or go to the nearest emergency department for evaluation and treatment.  Signed: Aldean BakerJanet E Sykes, NP 10/24/2018, 11:48 AM   Patient seen, Suicide Assessment Completed.  Disposition Plan Reviewed

## 2018-10-24 NOTE — Progress Notes (Signed)
  [facility] Discharge Plan :  Will you be returning to the same living situation after discharge:  Yes,  home At discharge, do you have transportation home?: Yes,  family member Do you have the ability to pay for your medications: Yes,  mental health  Release of information consent forms completed and submitted to medical records by CSW.   Patient to Follow up at: Follow-up Information    [facility].   Specialty:  Professional Counselor Why:  Please walk in within 3 days of hospital discharge to be assessed for outpatient mental health services including: medication management; individual counseling, and group therapy/support groups if interested. Walk in hours: Mon-Fri 9am-12pm. Thank you.  Contact information: [address] [place] [id number] [phone]          Next level of care provider has access to [facility] Link:no  Safety Planning and Suicide Prevention discussed: Yes,  SPE completed with pt; pt declined to consent to collatearl contact. SPI pamphlet and mobile crisis information provided to pt.   Have you used any form of tobacco in the last 30 days? (Cigarettes, Smokeless Tobacco, Cigars, and/or Pipes): Yes  Has patient been referred to the Quitline?: Patient refused referral  Patient has been referred for addiction treatment: Yes  Rona RavensHeather S Dexter Signor, LCSW 10/24/2018, 11:31 AM

## 2018-10-24 NOTE — Progress Notes (Signed)
Recreation Therapy Notes  Date: 12.27.19 Time: 0930 Location: [address] Dayroom  Group Topic: Stress Management  Goal Area(s) Addresses:  Patient will engage in stress Regulatory affairs officermanagement practice. Patient will be able to identify stress management techniques.  Intervention: Stress Management  Activity :  Mindful Meditation.  LRT introduced the stress management technique of meditation.  LRT played a meditation that allowed patients to observe the sounds around them, sensations they may have been feeling on their skin and how they were feeling in the moment.  Patients were to listen and follow along as meditation played to engage in the activity.    Education:  Stress Management, Discharge Planning.   Education Outcome: Acknowledges Education  Clinical Observations/Feedback:  Pt did not attend group.    Caroll RancherMarjette Bernie Fobes, LRT/CTRS         Caroll RancherLindsay, Janica Eldred A 10/24/2018 11:43 AM

## 2018-10-24 NOTE — Progress Notes (Signed)
Patient ID: Anita HarmanKatherine L Ball, female   DOB: [birth year], 42 y.o.   MRN: [id number]  Discharge Note  D) Patient discharged to lobby. Patient states readiness for discharge. Patient denies SI/HI, AVH and is not delusional or psychotic.   A) Written and verbal discharge instructions given to the patient. Patient accepting to information and verbalized understanding. Patient agrees to the discharge plan. Opportunity for questions and concerns presented to patient. Patient denied any further questions or concerns. All belongings returned to patient. Patient signed for return of belongings and discharge paperwork. Patient has completed their Suicide Safety Plan and has been provided Suicide Prevention Education. Patient provided an opportunity to complete and return Patient Satisfaction Survey.   R) Patient safely escorted to the lobby. [facility], prescriptions, personal belongings, follow-up recommendations and discharge paperwork.

## 2021-03-07 ENCOUNTER — Other Ambulatory Visit: Payer: Self-pay

## 2021-03-07 ENCOUNTER — Ambulatory Visit (HOSPITAL_COMMUNITY)
Admission: EM | Admit: 2021-03-07 | Discharge: 2021-03-07 | Disposition: A | Discharge status: Home / Self Care | Payer: 59 | Attending: Internal Medicine | Admitting: Internal Medicine

## 2021-03-07 ENCOUNTER — Encounter (HOSPITAL_COMMUNITY): Payer: Self-pay | Admitting: Emergency Medicine

## 2021-03-07 DIAGNOSIS — F410 Panic disorder [episodic paroxysmal anxiety] without agoraphobia: Secondary | ICD-10-CM | POA: Diagnosis not present

## 2021-03-07 DIAGNOSIS — R0789 Other chest pain: Secondary | ICD-10-CM | POA: Diagnosis not present

## 2021-03-07 HISTORY — DX: Epilepsy, unspecified, not intractable, without status epilepticus: G40.909

## 2021-03-07 MED ORDER — HYDROXYZINE HCL 25 MG PO TABS: 25.0000 mg | ORAL_TABLET | Freq: Four times a day (QID) | ORAL | 0 refills | Status: DC | PRN

## 2021-03-07 NOTE — ED Provider Notes (Signed)
[facility]    CSN: [id number] Arrival date & time: 03/07/21  1220      History   Chief Complaint Chief Complaint  Patient presents with  . Chest Pain  . Anxiety    X 2 months    HPI Anita Ball is a 45 y.o. female.   Patient here for evaluation of intermittent left-sided chest pain and palpitations that have been ongoing for the past 2 months.  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  Reports chest pain and palpitations are worse at night.  Has not taken any medications or treatments.  Denies any trauma, injury, or other precipitating event.  Denies any specific alleviating or aggravating factors.  Denies any fevers, shortness of breath, N/V/D, numbness, tingling, weakness, abdominal pain, or headaches.     The history is provided by the patient.  Chest Pain Associated symptoms: anxiety and palpitations   Anxiety Associated symptoms include chest pain.    Past Medical History:  Diagnosis Date  . Anxiety   . Depression   . Epilepsy (HCC)    "as a child"  . Heart murmur   . Seizures [facility])     Patient Active Problem List   Diagnosis Date Noted  . Schizoaffective disorder, bipolar type (HCC) 09/27/2016  . Chest pain 07/19/2014  . Systolic murmur 07/19/2014    Past Surgical History:  Procedure Laterality Date  . FRACTURE SURGERY      OB History   No obstetric history on file.      Home Medications    Prior to Admission medications   Medication Sig Start Date End Date Taking? Authorizing Provider  hydrOXYzine (ATARAX/VISTARIL) 25 MG tablet Take 1 tablet (25 mg total) by mouth every 6 (six) hours as needed for up to 15 doses for anxiety. 03/07/21  Yes Ivette Loyal, NP  asenapine (SAPHRIS) 5 MG SUBL 24 hr tablet Place 1 tablet (5 mg total) under the tongue every 12 (twelve) hours. For mood 10/24/18   Aldean Baker, NP  gabapentin  (NEURONTIN) 100 MG capsule Take 1 capsule (100 mg total) by mouth 3 (three) times daily. For anxiety/agitation 10/24/18   Aldean Baker, NP  nicotine (NICODERM CQ - DOSED IN MG/24 HR) 7 mg/24hr patch Place 1 patch (7 mg total) onto the skin daily. (May buy over the counter) 10/25/18   Aldean Baker, NP    Family History Family History  Problem Relation Age of Onset  . Depression Mother   . Alcoholism Father     Social History Social History   Tobacco Use  . Smoking status: Current Every Day Smoker    Packs/day: 1.50    Types: Cigarettes  . Smokeless tobacco: Never Used  Vaping Use  . Vaping Use: Never used  Substance Use Topics  . Alcohol use: Yes  . Drug use: Yes    Types: Marijuana     Allergies   Patient has no known allergies.   Review of Systems Review of Systems  Cardiovascular: Positive for chest pain and palpitations.  All other systems reviewed and are negative.    Physical Exam Triage Vital Signs ED Triage Vitals  Enc Vitals Group     BP 03/07/21 1334 (!) 148/80     Pulse Rate 03/07/21 1334 73     Resp 03/07/21 1334 20     Temp 03/07/21 1334 98.1 F (  36.7 C)     Temp Source 03/07/21 1334 Oral     SpO2 03/07/21 1334 98 %     Weight --      Height --      Head Circumference --      Peak Flow --      Pain Score 03/07/21 1330 0     Pain Loc --      Pain Edu? --      Excl. in GC? --    No data found.  Updated Vital Signs BP (!) 148/80 (BP Location: Left Arm)   Pulse 73   Temp 98.1 F (36.7 C) (Oral)   Resp 20   LMP  (LMP Unknown)   SpO2 98%   Visual Acuity Right Eye Distance:   Left Eye Distance:   Bilateral Distance:    Right Eye Near:   Left Eye Near:    Bilateral Near:     Physical Exam Vitals and nursing note reviewed.  Constitutional:      General: She is not in acute distress.    Appearance: Normal appearance. She is not ill-appearing, toxic-appearing or diaphoretic.  HENT:     Head: Normocephalic and atraumatic.   Eyes:     Conjunctiva/sclera: Conjunctivae normal.     Pupils: Pupils are equal, round, and reactive to light.  Cardiovascular:     Rate and Rhythm: Normal rate and regular rhythm.     Pulses: Normal pulses.     Heart sounds: Normal heart sounds. Heart sounds not distant. No murmur heard.  No systolic murmur is present.  No diastolic murmur is present.   Pulmonary:     Effort: Pulmonary effort is normal.     Breath sounds: Normal breath sounds.  Chest:     Chest wall: No mass, deformity, tenderness, crepitus or edema. There is no dullness to percussion.  Abdominal:     General: Abdomen is flat.  Musculoskeletal:        General: Normal range of motion.     Cervical back: Normal range of motion.  Skin:    General: Skin is warm and dry.  Neurological:     General: No focal deficit present.     Mental Status: She is alert and oriented to person, place, and time.  Psychiatric:        Mood and Affect: Mood normal.      UC Treatments / Results  Labs (all labs ordered are listed, but only abnormal results are displayed) Labs Reviewed - No data to display  EKG   Radiology No results found.  Procedures Procedures (including critical care time)  Medications Ordered in UC Medications - No data to display  Initial Impression / Assessment and Plan / UC Course  I have reviewed the triage vital signs and the nursing notes.  Pertinent labs & imaging results that were available during my care of the patient were reviewed by me and considered in my medical decision making (see chart for details).     Assessment negative for red flags or concerns.  Chest pain and palpitations likely related to anxiety or panic attacks.  EKG normal sinus rhythm with normal rate and rhythm.  Her HEART score is low.  Therefore, no further cardiac workup is warranted at this time.  Patient was given strict ED follow for worsening symptoms and as encouraged to follow up with cardiology.  Atarax  prescribed every 6 hours as needed for anxiety.  Patient given information to [facility].  Primary Care Assistance begun.  Encouraged patient to established and follow up with primary care.  Return as needed.  Final Clinical Impressions(s) / UC Diagnoses   Final diagnoses:  Anxiety attack  Other chest pain     Discharge Instructions     Take the Vistaril/Atarax as needed for anxiety.    Follow up with [facility] as needed for worsening anxiety and panic attacks.   If you have any worsening chest pain, please go to the Emergency Department for further evaluation.    Follow up with your cardiologist.   Get established and follow up with a primary care provider.      ED Prescriptions    Medication Sig Dispense Auth. Provider   hydrOXYzine (ATARAX/VISTARIL) 25 MG tablet Take 1 tablet (25 mg total) by mouth every 6 (six) hours as needed for up to 15 doses for anxiety. 15 tablet Ivette Loyal, NP     PDMP not reviewed this encounter.   Ivette Loyal, NP 03/07/21 1427

## 2021-03-07 NOTE — Discharge Instructions (Signed)
Take the Vistaril/Atarax as needed for anxiety.    Follow up with [facility] as needed for worsening anxiety and panic attacks.   If you have any worsening chest pain, please go to the Emergency Department for further evaluation.    Follow up with your cardiologist.   Get established and follow up with a primary care provider.

## 2021-03-07 NOTE — ED Triage Notes (Signed)
Pt presents today with c/o of intermittent mid chest pain x 2 months. Denies SOB, n/v/d. Does report history of arrhythmia.

## 2021-03-09 ENCOUNTER — Telehealth (HOSPITAL_BASED_OUTPATIENT_CLINIC_OR_DEPARTMENT_OTHER): Payer: Self-pay

## 2021-03-09 NOTE — Telephone Encounter (Signed)
-----   Message from Aaron Edelman, RN sent at 03/09/2021  1:51 PM EDT ----- Regarding: UC to PCP There were a couple this week that had [employer], and I wasn't sure if this is an insurance you accept.  If it's not, just respond and let me know which ones and I will re-route.  Thank you!  Patient needs to establish with PCP - routine

## 2021-06-06 LAB — RESULTS CONSOLE HPV: CHL HPV: NEGATIVE

## 2021-06-06 LAB — HM PAP SMEAR: HM Pap smear: NORMAL

## 2022-04-21 ENCOUNTER — Ambulatory Visit
Admission: EM | Admit: 2022-04-21 | Discharge: 2022-04-21 | Disposition: A | Discharge status: Home / Self Care | Payer: Commercial Managed Care - HMO | Attending: Urgent Care | Admitting: Urgent Care

## 2022-04-21 DIAGNOSIS — K047 Periapical abscess without sinus: Secondary | ICD-10-CM

## 2022-04-21 DIAGNOSIS — H04123 Dry eye syndrome of bilateral lacrimal glands: Secondary | ICD-10-CM | POA: Diagnosis not present

## 2022-04-21 DIAGNOSIS — G8929 Other chronic pain: Secondary | ICD-10-CM

## 2022-04-21 DIAGNOSIS — K089 Disorder of teeth and supporting structures, unspecified: Secondary | ICD-10-CM

## 2022-04-21 MED ORDER — AMOXICILLIN-POT CLAVULANATE 875-125 MG PO TABS: 1.0000 | ORAL_TABLET | Freq: Two times a day (BID) | ORAL | 0 refills | Status: DC

## 2022-04-21 MED ORDER — OLOPATADINE HCL 0.1 % OP SOLN: 1.0000 [drp] | Freq: Two times a day (BID) | OPHTHALMIC | 12 refills | Status: DC

## 2022-04-21 NOTE — ED Provider Notes (Signed)
[facility]   MRN: [id number] DOB: October 12, 1976  Subjective:   Anita Ball is a 46 y.o. female presenting for 3 to 4-day history of bilateral eye dryness, bilateral eye drooping, stinging sensation in both her eyes.  Patient thinks it might be related to dehydration as she has not been drinking water.  Wanted to make sure she was not having a stroke.  She does report 2 to 3-year history of persistent intermittent dizziness, shooting pains, flashes of light in her eyes, scotomas.  She does not have a neurologist, has not seen one for years.  She is also having facial and dental pain from her teeth.  States that she is supposed to get back with her dentist about having a root canal and a consultation regarding her teeth.  She is a smoker, uses marijuana, drinks alcohol.  No current facility-administered medications for this encounter.  Current Outpatient Medications:    asenapine (SAPHRIS) 5 MG SUBL 24 hr tablet, Place 1 tablet (5 mg total) under the tongue every 12 (twelve) hours. For mood, Disp: 60 tablet, Rfl: 0   gabapentin (NEURONTIN) 100 MG capsule, Take 1 capsule (100 mg total) by mouth 3 (three) times daily. For anxiety/agitation, Disp: 90 capsule, Rfl: 0   hydrOXYzine (ATARAX/VISTARIL) 25 MG tablet, Take 1 tablet (25 mg total) by mouth every 6 (six) hours as needed for up to 15 doses for anxiety., Disp: 15 tablet, Rfl: 0   nicotine (NICODERM CQ - DOSED IN MG/24 HR) 7 mg/24hr patch, Place 1 patch (7 mg total) onto the skin daily. (May buy over the counter), Disp: 1 patch, Rfl: 0   No Known Allergies  Past Medical History:  Diagnosis Date   Anxiety    Depression    Epilepsy (HCC)    "as a child"   Heart murmur    Seizures (HCC)      Past Surgical History:  Procedure Laterality Date   FRACTURE SURGERY      Family History  Problem Relation Age of Onset   Depression Mother    Alcoholism Father     Social History   Tobacco Use   Smoking  status: Every Day    Packs/day: 1.50    Types: Cigarettes   Smokeless tobacco: Never  Vaping Use   Vaping Use: Never used  Substance Use Topics   Alcohol use: Yes   Drug use: Yes    Types: Marijuana    ROS   Objective:   Vitals: BP 125/82 (BP Location: Right Arm)   Pulse 73   Temp 98.9 F (37.2 C) (Oral)   Resp 18   LMP 03/14/2022 (Approximate)   SpO2 97%   Physical Exam Constitutional:      General: She is not in acute distress.    Appearance: Normal appearance. She is well-developed and normal weight. She is not ill-appearing, toxic-appearing or diaphoretic.  HENT:     Head: Normocephalic and atraumatic.     Comments: No facial or eyelid droop.    Right Ear: Tympanic membrane, ear canal and external ear normal. No drainage or tenderness. No middle ear effusion. There is no impacted cerumen. Tympanic membrane is not erythematous.     Left Ear: Tympanic membrane, ear canal and external ear normal. No drainage or tenderness.  No middle ear effusion. There is no impacted cerumen. Tympanic membrane is not erythematous.     Nose: Nose normal. No congestion or rhinorrhea.     Mouth/Throat:  Mouth: Mucous membranes are moist. No oral lesions.     Pharynx: No pharyngeal swelling, oropharyngeal exudate, posterior oropharyngeal erythema or uvula swelling.     Tonsils: No tonsillar exudate or tonsillar abscesses.  Eyes:     General: Lids are normal. Lids are everted, no foreign bodies appreciated. Vision grossly intact. No scleral icterus.       Right eye: No foreign body, discharge or hordeolum.        Left eye: No foreign body, discharge or hordeolum.     Extraocular Movements: Extraocular movements intact.     Right eye: Normal extraocular motion.     Left eye: Normal extraocular motion and no nystagmus.     Conjunctiva/sclera: Conjunctivae normal.     Right eye: Right conjunctiva is not injected. No chemosis, exudate or hemorrhage.    Left eye: Left conjunctiva is not  injected. No chemosis, exudate or hemorrhage. Neck:     Meningeal: Brudzinski's sign and Kernig's sign absent.  Cardiovascular:     Rate and Rhythm: Normal rate.  Pulmonary:     Effort: Pulmonary effort is normal.  Musculoskeletal:     Cervical back: Normal range of motion and neck supple.  Lymphadenopathy:     Cervical: No cervical adenopathy.  Skin:    General: Skin is warm and dry.  Neurological:     General: No focal deficit present.     Mental Status: She is alert and oriented to person, place, and time.     Cranial Nerves: No cranial nerve deficit, dysarthria or facial asymmetry.     Motor: No weakness or pronator drift.     Coordination: Romberg sign negative. Coordination normal. Finger-Nose-Finger Test and Heel to [facility] Test normal. Rapid alternating movements normal.     Gait: Gait and tandem walk normal.     Deep Tendon Reflexes: Reflexes normal.     Comments: No facial asymmetry.  Psychiatric:        Mood and Affect: Mood normal.        Behavior: Behavior normal.        Thought Content: Thought content normal.        Judgment: Judgment normal.     Assessment and Plan :   PDMP not reviewed this encounter.  1. Dental infection   2. Dry eyes   3. Chronic dental pain    No signs of an acute encephalopathy.  Recommended following up with a neurologist.  I do not suspect Bell's palsy, stroke.  Regarding her eyes, recommended using olopatadine to address allergic conjunctivitis.  I will help her with her dental infection using Augmentin.  Recommended follow-up with her dentist soon as possible. Counseled patient on potential for adverse effects with medications prescribed/recommended today, ER and return-to-clinic precautions discussed, patient verbalized understanding.    Wallis Bamberg, [place] 04/21/22 1616

## 2022-05-09 ENCOUNTER — Encounter: Payer: Self-pay | Admitting: Emergency Medicine

## 2022-05-09 ENCOUNTER — Ambulatory Visit
Admission: EM | Admit: 2022-05-09 | Discharge: 2022-05-09 | Disposition: A | Discharge status: Home / Self Care | Payer: Commercial Managed Care - HMO | Attending: Emergency Medicine | Admitting: Emergency Medicine

## 2022-05-09 DIAGNOSIS — M272 Inflammatory conditions of jaws: Secondary | ICD-10-CM

## 2022-05-09 MED ORDER — MOXIFLOXACIN HCL 400 MG PO TABS: 400.0000 mg | ORAL_TABLET | Freq: Every day | ORAL | 0 refills | Status: AC

## 2022-05-09 NOTE — ED Provider Notes (Signed)
[facility]    CSN: [id number] Arrival date & time: 05/09/22  1912    HISTORY  No chief complaint on file.  HPI Anita Ball is a pleasant, 46 y.o. female who presents to urgent care today complaining of Lightheadedness and a throbbing sensation on the left side of her neck when she after she turns her head a certain way, states this has been going on for the past few days.  Patient states the sensation also occurs when she swallows there is a lump in her throat.  Patient states she has an incomplete dental procedure involving inserting teeth into her bone, states she has not been back for follow-up to have this completed and at this time the roots of the prosthetic teeth are still exposed.  The history is provided by the patient.   Past Medical History:  Diagnosis Date   Anxiety    Depression    Epilepsy [facility])    "as a child"   Heart murmur    Seizures (HCC)    Patient Active Problem List   Diagnosis Date Noted   Schizoaffective disorder, bipolar type (HCC) 09/27/2016   Chest pain 07/19/2014   Systolic murmur 07/19/2014   Past Surgical History:  Procedure Laterality Date   FRACTURE SURGERY     OB History   No obstetric history on file.    Home Medications    Prior to Admission medications   Medication Sig Start Date End Date Taking? Authorizing Provider  moxifloxacin (AVELOX) 400 MG tablet Take 1 tablet (400 mg total) by mouth daily at 8 pm for 7 days. 05/09/22 05/16/22 Yes Theadora Rama Scales, PA-C  hydrOXYzine (ATARAX/VISTARIL) 25 MG tablet Take 1 tablet (25 mg total) by mouth every 6 (six) hours as needed for up to 15 doses for anxiety. 03/07/21   Ivette Loyal, NP  nicotine (NICODERM CQ - DOSED IN MG/24 HR) 7 mg/24hr patch Place 1 patch (7 mg total) onto the skin daily. (May buy over the counter) 10/25/18   Aldean Baker, NP  olopatadine (PATANOL) 0.1 % ophthalmic solution Place 1 drop into both eyes 2 (two) times daily. 04/21/22   Wallis Bamberg,  PA-C    Family History Family History  Problem Relation Age of Onset   Depression Mother    Alcoholism Father    Social History Social History   Tobacco Use   Smoking status: Every Day    Packs/day: 1.50    Types: Cigarettes   Smokeless tobacco: Never  Vaping Use   Vaping Use: Never used  Substance Use Topics   Alcohol use: Yes   Drug use: Yes    Types: Marijuana   Allergies   Patient has no known allergies.  Review of Systems Review of Systems Pertinent findings revealed after performing a 14 point review of systems has been noted in the history of present illness.  Physical Exam Triage Vital Signs ED Triage Vitals  Enc Vitals Group     BP 08/25/21 0827 (!) 147/82     Pulse Rate 08/25/21 0827 72     Resp 08/25/21 0827 18     Temp 08/25/21 0827 98.3 F (36.8 C)     Temp Source 08/25/21 0827 Oral     SpO2 08/25/21 0827 98 %     Weight --      Height --      Head Circumference --      Peak Flow --      Pain Score 08/25/21  5170 5     Pain Loc --      Pain Edu? --      Excl. in GC? --   No data found.  Updated Vital Signs BP (!) 158/99 (BP Location: Right Arm)   Pulse 78   Temp 100 F (37.8 C) (Oral)   Resp 16   LMP 03/14/2022 (Approximate)   SpO2 98%   Physical Exam Vitals and nursing note reviewed.  Constitutional:      General: She is not in acute distress.    Appearance: Normal appearance.  HENT:     Head: Normocephalic and atraumatic.     Mouth/Throat:     Lips: Pink.     Mouth: Mucous membranes are moist. Mucous membranes are pale.     Dentition: Abnormal dentition. Dental tenderness, gingival swelling, dental caries, dental abscesses and gum lesions present.     Tongue: No lesions. Tongue does not deviate from midline.     Palate: No mass and lesions.     Pharynx: Oropharynx is clear. Uvula midline. No posterior oropharyngeal erythema or uvula swelling.  Eyes:     Pupils: Pupils are equal, round, and reactive to light.  Cardiovascular:      Rate and Rhythm: Normal rate and regular rhythm.  Pulmonary:     Effort: Pulmonary effort is normal.     Breath sounds: Normal breath sounds.  Musculoskeletal:        General: Normal range of motion.     Cervical back: Normal range of motion and neck supple.  Skin:    General: Skin is warm and dry.  Neurological:     General: No focal deficit present.     Mental Status: She is alert and oriented to person, place, and time. Mental status is at baseline.  Psychiatric:        Mood and Affect: Mood normal.        Behavior: Behavior normal.        Thought Content: Thought content normal.        Judgment: Judgment normal.     Visual Acuity Right Eye Distance:   Left Eye Distance:   Bilateral Distance:    Right Eye Near:   Left Eye Near:    Bilateral Near:     UC Couse / Diagnostics / Procedures:     Radiology No results found.  Procedures Procedures (including critical care time) EKG  Pending results:  Labs Reviewed - No data to display  Medications Ordered in UC: Medications - No data to display  UC Diagnoses / Final Clinical Impressions(s)   I have reviewed the triage vital signs and the nursing notes.  Pertinent labs & imaging results that were available during my care of the patient were reviewed by me and considered in my medical decision making (see chart for details).    Final diagnoses:  Odontogenic infection of jaw   Patient provided with a prescription for Avelox and advised that she must follow-up with her dental provider to complete this procedure or else this will never get better.  Patient verbalized understanding.  ED Prescriptions     Medication Sig Dispense Auth. Provider   moxifloxacin (AVELOX) 400 MG tablet Take 1 tablet (400 mg total) by mouth daily at 8 pm for 7 days. 7 tablet Theadora Rama Scales, PA-C      PDMP not reviewed this encounter.  Pending results:  Labs Reviewed - No data to display  Discharge  Instructions:   Discharge Instructions  Because you have been on Augmentin for several days and have actually had worsening signs of infection including low-grade temperature, swelling of the left side of your neck and swelling of the left side of your face, I believe that you require a stronger antibiotic.  Please discontinue Augmentin at this time and begin moxifloxacin 400 mg tablets, 1 tablet daily for 7 days.  It is imperative that you follow-up with your dentist as soon as possible regarding this infection.  Please remember that antibiotics for dental infections are merely a Band-Aid solution and the definitive treatment is to correct the dental problem to prevent further infection.  Thank you for visiting urgent care today.    Disposition Upon Discharge:  Condition: stable for discharge home  Patient presented with an acute illness with associated systemic symptoms and significant discomfort requiring urgent management. In my opinion, this is a condition that a prudent lay person (someone who possesses an average knowledge of health and medicine) may potentially expect to result in complications if not addressed urgently such as respiratory distress, impairment of bodily function or dysfunction of bodily organs.   Routine symptom specific, illness specific and/or disease specific instructions were discussed with the patient and/or caregiver at length.   As such, the patient has been evaluated and assessed, work-up was performed and treatment was provided in alignment with urgent care protocols and evidence based medicine.  Patient/parent/caregiver has been advised that the patient may require follow up for further testing and treatment if the symptoms continue in spite of treatment, as clinically indicated and appropriate.  Patient/parent/caregiver has been advised to [facility] or [facility]CP if no better; to PCP or the Emergency Department if new signs and symptoms develop, or if  the current signs or symptoms continue to change or worsen for further workup, evaluation and treatment as clinically indicated and appropriate  The patient will follow up with their current PCP if and as advised. If the patient does not currently have a PCP we will assist them in obtaining one.   The patient may need specialty follow up if the symptoms continue, in spite of conservative treatment and management, for further workup, evaluation, consultation and treatment as clinically indicated and appropriate.   Patient/parent/caregiver verbalized understanding and agreement of plan as discussed.  All questions were addressed during visit.  Please see discharge instructions below for further details of plan.  This office note has been dictated using Teaching laboratory technician.  Unfortunately, this method of dictation can sometimes lead to typographical or grammatical errors.  I apologize for your inconvenience in advance if this occurs.  Please do not hesitate to reach out to me if clarification is needed.      Theadora Rama Scales, [place] 05/11/22 [phone]

## 2022-05-09 NOTE — ED Triage Notes (Addendum)
Reports that she started having some lightheadedness with throbbing sensation in her left neck whenever she turns her head certain ways over the last few days. States the sensation also occurs when she swallows as well, like there may be a lump in her throat.

## 2022-05-09 NOTE — Discharge Instructions (Signed)
Because you have been on Augmentin for several days and have actually had worsening signs of infection including low-grade temperature, swelling of the left side of your neck and swelling of the left side of your face, I believe that you require a stronger antibiotic.  Please discontinue Augmentin at this time and begin moxifloxacin 400 mg tablets, 1 tablet daily for 7 days.  It is imperative that you follow-up with your dentist as soon as possible regarding this infection.  Please remember that antibiotics for dental infections are merely a Band-Aid solution and the definitive treatment is to correct the dental problem to prevent further infection.  Thank you for visiting urgent care today.

## 2022-05-12 ENCOUNTER — Emergency Department (HOSPITAL_COMMUNITY)
Admission: EM | Admit: 2022-05-12 | Discharge: 2022-05-13 | Disposition: A | Discharge status: Home / Self Care | Payer: Commercial Managed Care - HMO | Attending: Emergency Medicine | Admitting: Emergency Medicine

## 2022-05-12 ENCOUNTER — Encounter (HOSPITAL_COMMUNITY): Payer: Self-pay

## 2022-05-12 ENCOUNTER — Emergency Department (HOSPITAL_COMMUNITY): Payer: Commercial Managed Care - HMO

## 2022-05-12 ENCOUNTER — Other Ambulatory Visit: Payer: Self-pay

## 2022-05-12 DIAGNOSIS — K029 Dental caries, unspecified: Secondary | ICD-10-CM

## 2022-05-12 DIAGNOSIS — R002 Palpitations: Secondary | ICD-10-CM | POA: Diagnosis not present

## 2022-05-12 DIAGNOSIS — I10 Essential (primary) hypertension: Secondary | ICD-10-CM | POA: Insufficient documentation

## 2022-05-12 DIAGNOSIS — R519 Headache, unspecified: Secondary | ICD-10-CM | POA: Insufficient documentation

## 2022-05-12 DIAGNOSIS — R220 Localized swelling, mass and lump, head: Secondary | ICD-10-CM | POA: Diagnosis present

## 2022-05-12 DIAGNOSIS — R739 Hyperglycemia, unspecified: Secondary | ICD-10-CM | POA: Diagnosis not present

## 2022-05-12 LAB — CBC
HCT: 40.8 % (ref 36.0–46.0)
Hemoglobin: 13.9 g/dL (ref 12.0–15.0)
MCH: 30.7 pg (ref 26.0–34.0)
MCHC: 34.1 g/dL (ref 30.0–36.0)
MCV: 90.1 fL (ref 80.0–100.0)
Platelets: 246 10*3/uL (ref 150–400)
RBC: 4.53 MIL/uL (ref 3.87–5.11)
RDW: 14 % (ref 11.5–15.5)
WBC: 7.9 10*3/uL (ref 4.0–10.5)
nRBC: 0 % (ref 0.0–0.2)

## 2022-05-12 LAB — BASIC METABOLIC PANEL
Anion gap: 10 (ref 5–15)
BUN: 8 mg/dL (ref 6–20)
CO2: 22 mmol/L (ref 22–32)
Calcium: 9.4 mg/dL (ref 8.9–10.3)
Chloride: 110 mmol/L (ref 98–111)
Creatinine, Ser: 0.87 mg/dL (ref 0.44–1.00)
GFR, Estimated: >60 mL/min (ref >60)
Glucose, Bld: 148 mg/dL — ABNORMAL HIGH (ref 70–99)
Potassium: 3.8 mmol/L (ref 3.5–5.1)
Sodium: 142 mmol/L (ref 135–145)

## 2022-05-12 LAB — I-STAT BETA HCG BLOOD, ED (MC, WL, AP ONLY): I-stat hCG, quantitative: <5 m[IU]/mL (ref <5)

## 2022-05-12 LAB — TROPONIN I (HIGH SENSITIVITY)
Troponin I (High Sensitivity): 5 ng/L (ref <18)
Troponin I (High Sensitivity): 3 ng/L (ref <18)

## 2022-05-12 NOTE — ED Triage Notes (Signed)
Patient reports that she is currently being treated for mouth/dental infection. Reports racing heart, sob and states that this was all occurring prior to infection. smoker

## 2022-05-13 NOTE — Discharge Instructions (Signed)
Continue your antibiotics Follow-up with dentist as scheduled Smoking cessation as discussed Please call the number on your charge instructions to establish primary care You need to have your blood pressure and blood sugars rechecked as an outpatient

## 2022-05-13 NOTE — ED Provider Notes (Signed)
[facility] EMERGENCY DEPARTMENT Provider Note   CSN: [id number] Arrival date & time: 05/12/22  1544     History  Chief Complaint  Patient presents with  . jaw/neck infection/ body swelling    Anita Ball is a 46 y.o. female.  HPI 46 yo female presents today complaining of diffuse swelling, dental infection with antibiotics started, intermittent palpitations.  She will feel if she has palpitations and then she will feel like there is diffuse swelling.  She has some pain in her head in the right forehead area that also feels like fullness and swelling.  She is not running a fever, no cough, abdominal pain.  She is taking her antibiotics as prescribed and the dental symptoms have improved.    Home Medications Prior to Admission medications   Medication Sig Start Date End Date Taking? Authorizing Provider  hydrOXYzine (ATARAX/VISTARIL) 25 MG tablet Take 1 tablet (25 mg total) by mouth every 6 (six) hours as needed for up to 15 doses for anxiety. 03/07/21   Ivette Loyal, NP  moxifloxacin (AVELOX) 400 MG tablet Take 1 tablet (400 mg total) by mouth daily at 8 pm for 7 days. 05/09/22 05/16/22  Theadora Rama Scales, PA-C  nicotine (NICODERM CQ - DOSED IN MG/24 HR) 7 mg/24hr patch Place 1 patch (7 mg total) onto the skin daily. (May buy over the counter) 10/25/18   Aldean Baker, NP  olopatadine (PATANOL) 0.1 % ophthalmic solution Place 1 drop into both eyes 2 (two) times daily. 04/21/22   Wallis Bamberg, PA-C      Allergies    Patient has no known allergies.    Review of Systems   Review of Systems  Physical Exam Updated Vital Signs BP (!) 144/88   Pulse 65   Temp 98.3 F (36.8 C)   Resp 14   LMP 03/14/2022 (Approximate)   SpO2 97%  Physical Exam Vitals and nursing note reviewed.  Constitutional:      Appearance: Normal appearance.  HENT:     Head: Normocephalic.     Right Ear: Tympanic membrane, ear canal and external ear normal.     Left Ear:  Tympanic membrane, ear canal and external ear normal.     Nose: Nose normal.     Mouth/Throat:     Pharynx: Oropharynx is clear.  Eyes:     Extraocular Movements: Extraocular movements intact.     Pupils: Pupils are equal, round, and reactive to light.  Neck:     Vascular: No carotid bruit.  Cardiovascular:     Rate and Rhythm: Normal rate and regular rhythm.     Pulses: Normal pulses.     Heart sounds: Normal heart sounds.  Pulmonary:     Effort: Pulmonary effort is normal.     Breath sounds: Normal breath sounds.  Abdominal:     General: Abdomen is flat. Bowel sounds are normal.     Palpations: Abdomen is soft.  Musculoskeletal:        General: Normal range of motion.     Cervical back: Normal range of motion. No rigidity or tenderness.  Lymphadenopathy:     Cervical: No cervical adenopathy.  Skin:    General: Skin is warm and dry.     Capillary Refill: Capillary refill takes less than 2 seconds.     Findings: No lesion or rash.  Neurological:     General: No focal deficit present.     Mental Status: She is alert and oriented  to person, place, and time.     Cranial Nerves: No cranial nerve deficit.  Psychiatric:        Mood and Affect: Mood normal.     ED Results / Procedures / Treatments   Labs (all labs ordered are listed, but only abnormal results are displayed) Labs Reviewed  BASIC METABOLIC PANEL - Abnormal; Notable for the following components:      Result Value   Glucose, Bld 148 (*)    All other components within normal limits  CBC  I-STAT BETA HCG BLOOD, ED ([facility], [facility], [facility] ONLY)  TROPONIN I (HIGH SENSITIVITY)  TROPONIN I (HIGH SENSITIVITY)    EKG EKG Interpretation  Date/Time:  Saturday May 12 2022 16:15:19 EDT Ventricular Rate:  73 PR Interval:  152 QRS Duration: 82 QT Interval:  380 QTC Calculation: 418 R Axis:   60 Text Interpretation: Normal sinus rhythm Normal ECG When compared with ECG of 07-Mar-2021 13:44, No significant change was found  Confirmed by Dione Booze ([id number]) on 05/13/2022 3:12:00 AM  Radiology DG Chest 2 View  Result Date: 05/12/2022 CLINICAL DATA:  Chest pain. EXAM: CHEST - 2 VIEW COMPARISON:  09/26/2015 FINDINGS: The cardiomediastinal contours are normal. Mild peribronchial thickening. Pulmonary vasculature is normal. No consolidation, pleural effusion, or pneumothorax. No acute osseous abnormalities are seen. Telemetry leads overlie the chest. IMPRESSION: Mild peribronchial thickening suggesting bronchitis or asthma. Electronically Signed   By: Narda Rutherford M.D.   On: 05/12/2022 16:53    Procedures Procedures    Medications Ordered in ED Medications - No data to display  ED Course/ Medical Decision Making/ A&P Clinical Course as of 05/13/22 1009  Sun May 13, 2022  1000 Basic metabolic panel(!) Bmet reviewed and interpreted with hyperglycemia 148  [DR]  1001 CBC Cbc revieweed and interpreted and normal  [DR]  1001 Troponin x2 normal [DR]  1007 Chest x-Briani Maul reviewed interpreted with mild peribronchial thickening [DR]    Clinical Course User Index [DR] Margarita Grizzle, MD                           Medical Decision Making 1- dental infection- appears well controlled with abx and has dental follow up.  No current sign of infection.  Plan continue moxofloxacin as prescribed 2-palpitations- no acute ischemia on ekg, troponins normal, suspect some component of anxiety 3- swelling- patient is having intermittent swelling.  No edema or swelling now.  Could represent inflammatory or autoimmune disorder.  Other, currently labs are normal.  Patient advised that next pathway forward is to follow-up with [facility] care for further evaluation work-up 4-hypertension patient states that she is normally not hypertensive but-is hypertensive in the acute care situation.  She is advised regarding need for follow-up 5 headache patient complaining of right frontal headache.  She has no focal neurological deficits.  No  significant discharge.  There is no tenderness over her temporal artery.  I have a low index of suspicion for acute intracranial abnormality.  Do not feel there is indication for further imaging or work-up at this time. 6 patient with hyperglycemia.  Patient appears stable with no evidence of DKA.  Patient advised regarding need for follow-up outpatient 7 smoking cessation discussed  Amount and/or Complexity of Data Reviewed Labs: ordered. Decision-making details documented in ED Course. Radiology: ordered and independent interpretation performed. Decision-making details documented in ED Course.          Final Clinical Impression(s) / ED Diagnoses Final diagnoses:  Dental caries  Palpitations  Nonintractable headache, unspecified chronicity pattern, unspecified headache type    Rx / DC Orders ED Discharge Orders     None         Margarita Grizzle, MD 05/13/22 1009

## 2022-06-06 ENCOUNTER — Ambulatory Visit (INDEPENDENT_AMBULATORY_CARE_PROVIDER_SITE_OTHER): Payer: Commercial Managed Care - HMO

## 2022-06-06 ENCOUNTER — Encounter: Payer: Self-pay | Admitting: Nurse Practitioner

## 2022-06-06 ENCOUNTER — Ambulatory Visit (INDEPENDENT_AMBULATORY_CARE_PROVIDER_SITE_OTHER): Payer: Commercial Managed Care - HMO | Admitting: Nurse Practitioner

## 2022-06-06 VITALS — BP 116/80 | HR 77 | Temp 97.7°F | Ht 63.0 in | Wt 188.4 lb

## 2022-06-06 DIAGNOSIS — F419 Anxiety disorder, unspecified: Secondary | ICD-10-CM | POA: Diagnosis not present

## 2022-06-06 DIAGNOSIS — Z13220 Encounter for screening for lipoid disorders: Secondary | ICD-10-CM | POA: Diagnosis not present

## 2022-06-06 DIAGNOSIS — G8929 Other chronic pain: Secondary | ICD-10-CM

## 2022-06-06 DIAGNOSIS — Z720 Tobacco use: Secondary | ICD-10-CM

## 2022-06-06 DIAGNOSIS — F250 Schizoaffective disorder, bipolar type: Secondary | ICD-10-CM | POA: Diagnosis not present

## 2022-06-06 DIAGNOSIS — Z1211 Encounter for screening for malignant neoplasm of colon: Secondary | ICD-10-CM

## 2022-06-06 DIAGNOSIS — M79644 Pain in right finger(s): Secondary | ICD-10-CM | POA: Diagnosis not present

## 2022-06-06 DIAGNOSIS — Z1212 Encounter for screening for malignant neoplasm of rectum: Secondary | ICD-10-CM

## 2022-06-06 DIAGNOSIS — R7301 Impaired fasting glucose: Secondary | ICD-10-CM | POA: Insufficient documentation

## 2022-06-06 DIAGNOSIS — K029 Dental caries, unspecified: Secondary | ICD-10-CM

## 2022-06-06 LAB — CBC WITH DIFFERENTIAL/PLATELET
Basophils Absolute: 0.1 10*3/uL (ref 0.0–0.1)
Basophils Relative: 0.8 % (ref 0.0–3.0)
Eosinophils Absolute: 0.1 10*3/uL (ref 0.0–0.7)
Eosinophils Relative: 1.3 % (ref 0.0–5.0)
HCT: 44.9 % (ref 36.0–46.0)
Hemoglobin: 15 g/dL (ref 12.0–15.0)
Lymphocytes Relative: 38.1 % (ref 12.0–46.0)
Lymphs Abs: 3 10*3/uL (ref 0.7–4.0)
MCHC: 33.4 g/dL (ref 30.0–36.0)
MCV: 93.3 fl (ref 78.0–100.0)
Monocytes Absolute: 0.5 10*3/uL (ref 0.1–1.0)
Monocytes Relative: 6 % (ref 3.0–12.0)
Neutro Abs: 4.3 10*3/uL (ref 1.4–7.7)
Neutrophils Relative %: 53.8 % (ref 43.0–77.0)
Platelets: 217 10*3/uL (ref 150.0–400.0)
RBC: 4.81 Mil/uL (ref 3.87–5.11)
RDW: 14.3 % (ref 11.5–15.5)
WBC: 8 10*3/uL (ref 4.0–10.5)

## 2022-06-06 LAB — COMPREHENSIVE METABOLIC PANEL
ALT: 15 U/L (ref 0–35)
AST: 16 U/L (ref 0–37)
Albumin: 4.3 g/dL (ref 3.5–5.2)
Alkaline Phosphatase: 35 U/L — ABNORMAL LOW (ref 39–117)
BUN: 10 mg/dL (ref 6–23)
CO2: 21 mEq/L (ref 19–32)
Calcium: 9.5 mg/dL (ref 8.4–10.5)
Chloride: 107 mEq/L (ref 96–112)
Creatinine, Ser: 0.73 mg/dL (ref 0.40–1.20)
GFR: 99.56 mL/min (ref >60.00)
Glucose, Bld: 110 mg/dL — ABNORMAL HIGH (ref 70–99)
Potassium: 3.5 mEq/L (ref 3.5–5.1)
Sodium: 140 mEq/L (ref 135–145)
Total Bilirubin: 0.4 mg/dL (ref 0.2–1.2)
Total Protein: 7 g/dL (ref 6.0–8.3)

## 2022-06-06 LAB — LIPID PANEL
Cholesterol: 184 mg/dL (ref 0–200)
HDL: 48.1 mg/dL (ref >39.00)
LDL Cholesterol: 100 mg/dL — ABNORMAL HIGH (ref 0–99)
NonHDL: 135.45
Total CHOL/HDL Ratio: 4
Triglycerides: 176 mg/dL — ABNORMAL HIGH (ref 0.0–149.0)
VLDL: 35.2 mg/dL (ref 0.0–40.0)

## 2022-06-06 LAB — TSH: TSH: 0.97 u[IU]/mL (ref 0.35–5.50)

## 2022-06-06 LAB — HEMOGLOBIN A1C: Hgb A1c MFr Bld: 5.5 % (ref 4.6–6.5)

## 2022-06-06 MED ORDER — HYDROXYZINE HCL 10 MG PO TABS: 10.0000 mg | ORAL_TABLET | Freq: Three times a day (TID) | ORAL | 0 refills | Status: DC | PRN

## 2022-06-06 MED ORDER — MOXIFLOXACIN HCL 400 MG PO TABS: 400.0000 mg | ORAL_TABLET | Freq: Every day | ORAL | 0 refills | Status: DC

## 2022-06-06 NOTE — Assessment & Plan Note (Signed)
Chronic, ongoing. PHQ 9 is a 13 and GAD 7 is a 12. She denies SI/HI. She does have a history of bipolar and was hospitalized in 2019. Will order hydroxyzine to take as needed, she may need a daily medication as well. Follow-up in 4 weeks.

## 2022-06-06 NOTE — Assessment & Plan Note (Signed)
She currently smokes 1.5 ppd. Encouraged complete tobacco cessation.

## 2022-06-06 NOTE — Assessment & Plan Note (Addendum)
Glucose has been elevated in the past, will check A1c and CMP today.

## 2022-06-06 NOTE — Progress Notes (Unsigned)
Initial visit for RT thumb pain. PT indicated worse pain is at the base of her RT thumb, in anatomical position. 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

## 2022-06-06 NOTE — Progress Notes (Signed)
New Patient Visit  BP 116/80   Pulse 77   Temp 97.7 F (36.5 C) (Temporal)   Ht 5\' 3"  (1.6 m)   Wt 188 lb 6.4 oz (85.5 kg)   SpO2 100%   BMI 33.37 kg/m    Subjective:    Patient ID: , female    DOB: 02/04/1976, 46 y.o.   MRN: [id number]  CC: Chief Complaint  Patient presents with   Establish Care    Np. Est care. Overall health assessment. Pt c/o infection in mouth for several days. Pt states infection is due to infected teeth.     HPI: Anita Ball is a 45 y.o. female presents for new patient visit to establish care.  Introduced to 54 role and practice setting.  All questions answered.  Discussed provider/patient relationship and expectations.  She has been experiencing right thumb pain along with right thumb trigger finger. This has been ongoing for a few months. She feels like she has inflammation all over her body. Certain movements will make the pain worse. She states that ice seems to help the most.   She has also been dealing with an infection in her teeth. She has an appointment on 06/26/22 to have 3 teeth removed. She just finished amoxicillin and Moxifloxacin. She is still having intermittent fevers and pain in her mouth. She feels like the Moxifloxacin has helped the most.   She has a history of anxiety. She states that her anxiety is ongoing. She usually takes a tylenol pm as needed. She has taken xanax and hdyroxyzine in the past. She denies SI/HI.      06/06/2022    1:27 PM 08/17/2017    2:50 PM  Depression screen PHQ 2/9  Decreased Interest 2 0  Down, Depressed, Hopeless 1 0  PHQ - 2 Score 3 0  Altered sleeping 1   Tired, decreased energy 3   Change in appetite 0   Feeling bad or failure about yourself  0   Trouble concentrating 3   Moving slowly or fidgety/restless 3   Suicidal thoughts 0   PHQ-9 Score 13   Difficult doing work/chores Somewhat difficult       06/06/2022    1:28 PM  GAD 7 : Generalized Anxiety  Score  Nervous, Anxious, on Edge 3  Control/stop worrying 0  Worry too much - different things 0  Trouble relaxing 3  Restless 3  Easily annoyed or irritable 3  Afraid - awful might happen 0  Total GAD 7 Score 12  Anxiety Difficulty Somewhat difficult    Past Medical History:  Diagnosis Date   Anxiety    Depression    Epilepsy (HCC)    "as a child"   Heart murmur    Seizures (HCC)     Past Surgical History:  Procedure Laterality Date   FRACTURE SURGERY      Family History  Problem Relation Age of Onset   Depression Mother    Diabetes Father    Cancer Father        colon & melanoma   Alcoholism Father    Diabetes Maternal Grandfather      Social History   Tobacco Use   Smoking status: Every Day    Packs/day: 1.50    Types: Cigarettes   Smokeless tobacco: Never  Vaping Use   Vaping Use: Never used  Substance Use Topics   Alcohol use: Yes    Comment: occasionally   Drug use: Yes  Types: Marijuana    No current outpatient medications on file prior to visit.   No current facility-administered medications on file prior to visit.     Review of Systems  Constitutional:  Positive for fatigue. Negative for fever.  HENT:  Negative for ear pain.        Tooth pain  Eyes: Negative.   Respiratory: Negative.    Cardiovascular: Negative.   Gastrointestinal: Negative.   Genitourinary: Negative.   Musculoskeletal:  Positive for arthralgias (right thumb).  Skin: Negative.   Neurological: Negative.   Psychiatric/Behavioral:  The patient is nervous/anxious.       Objective:    BP 116/80   Pulse 77   Temp 97.7 F (36.5 C) (Temporal)   Ht 5\' 3"  (1.6 m)   Wt 188 lb 6.4 oz (85.5 kg)   SpO2 100%   BMI 33.37 kg/m   Wt Readings from Last 3 Encounters:  06/06/22 188 lb 6.4 oz (85.5 kg)  08/17/17 186 lb 12.8 oz (84.7 kg)  07/19/14 175 lb 9.6 oz (79.7 kg)    BP Readings from Last 3 Encounters:  06/06/22 116/80  05/13/22 (!) 156/96  05/09/22 (!) 158/99     Physical Exam Vitals and nursing note reviewed.  Constitutional:      General: She is not in acute distress.    Appearance: Normal appearance.  HENT:     Head: Normocephalic.     Right Ear: Tympanic membrane, ear canal and external ear normal.     Left Ear: Tympanic membrane, ear canal and external ear normal.     Mouth/Throat:     Dentition: Dental caries present.  Eyes:     Conjunctiva/sclera: Conjunctivae normal.  Cardiovascular:     Rate and Rhythm: Normal rate and regular rhythm.     Pulses: Normal pulses.     Heart sounds: Normal heart sounds.  Pulmonary:     Effort: Pulmonary effort is normal.     Breath sounds: Normal breath sounds.  Abdominal:     Palpations: Abdomen is soft.     Tenderness: There is no abdominal tenderness.  Musculoskeletal:        General: Tenderness (base of right thumb) present. Normal range of motion.     Cervical back: Normal range of motion and neck supple. No tenderness.  Lymphadenopathy:     Cervical: No cervical adenopathy.  Skin:    General: Skin is warm.  Neurological:     General: No focal deficit present.     Mental Status: She is alert and oriented to person, place, and time.  Psychiatric:        Mood and Affect: Mood normal.        Behavior: Behavior normal.        Thought Content: Thought content normal.        Judgment: Judgment normal.       Assessment & Plan:   Problem List Items Addressed This Visit       Endocrine   IFG (impaired fasting glucose)    Glucose has been elevated in the past, will check A1c and CMP today.       Relevant Orders   Hemoglobin A1c (Completed)     Other   Tobacco use    She currently smokes 1.5 ppd. Encouraged complete tobacco cessation.       Schizoaffective disorder, bipolar type (HCC)    Chronic, ongoing. PHQ 9 is a 13 and GAD 7 is a 12. She denies SI/HI. She does  have a history of bipolar and was hospitalized in 2019. Will order hydroxyzine to take as needed, she may need a daily  medication as well. Follow-up in 4 weeks.       Chronic pain of right thumb - Primary    Chronic, ongoing. 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 She does also have trigger finger in that thumb. She can wear a thumb splint and continue ibuprofen as needed for pain. Referral placed to orthopedics. X-ray done per patient request. She also feels like she has a lot of inflammation all over her body. Will check ANA and RF.       Relevant Orders   Ambulatory referral to Orthopedic Surgery   DG Finger Thumb Right (Completed)   CBC with Differential/Platelet (Completed)   Comprehensive metabolic panel (Completed)   ANA w/Reflex   Rheumatoid Factor   Anxiety    Chronic, ongoing. PHQ 9 is a 13 and GAD 7 is a 12. She denies SI/HI. She does have a history of bipolar and was hospitalized in 2019. Will order hydroxyzine to take as needed, she may need a daily medication as well. Will check TSH today. Follow-up in 4 weeks.       Relevant Medications   hydrOXYzine (ATARAX) 10 MG tablet   Other Relevant Orders   CBC with Differential/Platelet (Completed)   Comprehensive metabolic panel (Completed)   TSH (Completed)   Other Visit Diagnoses     Dental caries       She has an appointment to pull 3 teeth 8/29. With ongoing fevers, will refill moxifloxacin.    Encounter for colorectal cancer screening       With family history of colon cancer, referral placed for colonoscopy.    Relevant Orders   Ambulatory referral to Gastroenterology   Screening, lipid       Screen lipid panel today   Relevant Orders   Lipid panel (Completed)        Follow up plan: Return in about 4 weeks (around 07/04/2022) for CPE.

## 2022-06-06 NOTE — Assessment & Plan Note (Addendum)
Chronic, ongoing. 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 She does also have trigger finger in that thumb. She can wear a thumb splint and continue ibuprofen as needed for pain. Referral placed to orthopedics. X-ray done per patient request. She also feels like she has a lot of inflammation all over her body. Will check ANA and RF.

## 2022-06-06 NOTE — Assessment & Plan Note (Addendum)
Chronic, ongoing. PHQ 9 is a 13 and GAD 7 is a 12. She denies SI/HI. She does have a history of bipolar and was hospitalized in 2019. Will order hydroxyzine to take as needed, she may need a daily medication as well. Will check TSH today. Follow-up in 4 weeks.

## 2022-06-06 NOTE — Patient Instructions (Signed)
It was great to see you!  We are going to check an x-ray of your thumb/hand.   Start moxifloxacin 1 tablet daily for your infection  We are checking your labs today and will let you know the results via mychart/phone.   Start hydroxyzine as needed for anxiety. This may make you sleepy   Let's follow-up in 4 weeks, sooner if you have concerns.  If a referral was placed today, you will be contacted for an appointment. Please note that routine referrals can sometimes take up to 3-4 weeks to process. Please call our office if you haven't heard anything after this time frame.  Take care,  Rodman Pickle, NP

## 2022-06-07 LAB — ANA W/REFLEX: Anti Nuclear Antibody (ANA): NEGATIVE

## 2022-06-08 LAB — RHEUMATOID FACTOR: Rheumatoid fact SerPl-aCnc: <14 IU/mL (ref <14)

## 2022-06-18 ENCOUNTER — Ambulatory Visit: Payer: Commercial Managed Care - HMO | Admitting: Orthopedic Surgery

## 2022-06-22 ENCOUNTER — Telehealth: Payer: Self-pay | Admitting: Orthopedic Surgery

## 2022-06-22 NOTE — Telephone Encounter (Signed)
Called pt unable to leave a vm for pt to schedule with another doctor. Dr. Frazier Butt is not accepting new pt's and last day is Thursday. Will try again later. If pt call please make appt with another dr.

## 2022-06-22 NOTE — Telephone Encounter (Signed)
Called pt 2X and unable to l;eave a vm mail. Need to set an np appt with another provider

## 2022-06-23 ENCOUNTER — Ambulatory Visit
Admission: EM | Admit: 2022-06-23 | Discharge: 2022-06-23 | Disposition: A | Discharge status: Home / Self Care | Payer: Commercial Managed Care - HMO | Attending: Emergency Medicine | Admitting: Emergency Medicine

## 2022-06-23 DIAGNOSIS — K047 Periapical abscess without sinus: Secondary | ICD-10-CM | POA: Diagnosis not present

## 2022-06-23 DIAGNOSIS — J36 Peritonsillar abscess: Secondary | ICD-10-CM | POA: Diagnosis not present

## 2022-06-23 MED ORDER — FLUCONAZOLE 150 MG PO TABS: ORAL_TABLET | ORAL | 0 refills | Status: DC

## 2022-06-23 MED ORDER — AMOXICILLIN-POT CLAVULANATE 875-125 MG PO TABS: 1.0000 | ORAL_TABLET | Freq: Two times a day (BID) | ORAL | 0 refills | Status: AC

## 2022-06-23 NOTE — ED Provider Notes (Signed)
[facility]    CSN: [id number] Arrival date & time: 06/23/22  1509    HISTORY   Chief Complaint  Patient presents with   Fever   Dental Pain   Dizziness   Nausea   HPI Anita Ball is a pleasant, 46 y.o. female who presents to urgent care today. Patient reports a history of dental caries and is scheduled to have some teeth extracted 3 days from now.  Patient states that she has previously had swelling and the left side of her throat that was treated with moxifloxacin, last dose was August 14.  Patient states initially she felt better but feels the swelling is coming back.  Patient states occasionally she is feeling dizzy, having flashes of feeling warm, swelling of the left side of her neck and in her jaws.  Patient states this began 2 days ago.  The history is provided by the patient.   Past Medical History:  Diagnosis Date   Anxiety    Depression    Epilepsy [facility])    "as a child"   Heart murmur    Seizures ([place])    Patient Active Problem List   Diagnosis Date Noted   Chronic pain of right thumb 06/06/2022   Anxiety 06/06/2022   IFG (impaired fasting glucose) 06/06/2022   Schizoaffective disorder, bipolar type ([place]) 09/27/2016   Tobacco use [id number]   Systolic murmur [id number]   Past Surgical History:  Procedure Laterality Date   FRACTURE SURGERY     OB History   No obstetric history on file.    Home Medications    Prior to Admission medications   Medication Sig Start Date End Date Taking? Authorizing Provider  hydrOXYzine (ATARAX) 10 MG tablet Take 1 tablet (10 mg total) by mouth 3 (three) times daily as needed. 06/06/22   McElwee, [name], NP  moxifloxacin (AVELOX) 400 MG tablet Take 1 tablet (400 mg total) by mouth daily. 06/06/22   McElwee, Scheryl Darter, NP    Family History Family History  Problem Relation Age of Onset   Depression Mother    Diabetes Father    Cancer Father        colon & melanoma   Alcoholism Father    Diabetes  Maternal Grandfather    Social History Social History   Tobacco Use   Smoking status: Every Day    Packs/day: 1.50    Types: Cigarettes   Smokeless tobacco: Never  Vaping Use   Vaping Use: Never used  Substance Use Topics   Alcohol use: Yes    Comment: occasionally   Drug use: Yes    Types: Marijuana   Allergies   Patient has no known allergies.  Review of Systems Review of Systems Pertinent findings revealed after performing a 14 point review of systems has been noted in the history of present illness.  Physical Exam Triage Vital Signs ED Triage Vitals  Enc Vitals Group     BP 08/25/21 0827 (!) 147/82     Pulse Rate 08/25/21 0827 72     Resp 08/25/21 0827 18     Temp 08/25/21 0827 98.3 F (36.8 C)     Temp Source 08/25/21 0827 Oral     SpO2 08/25/21 0827 98 %     Weight --      Height --      Head Circumference --      Peak Flow --      Pain Score 08/25/21 0826 5  Pain Loc --      Pain Edu? --      Excl. in GC? --   No data found.  Updated Vital Signs BP 137/79 (BP Location: Right Arm)   Pulse 72   Temp 98.5 F (36.9 C) (Oral)   Resp 16   LMP 06/12/2022 (Approximate)   SpO2 97%   Physical Exam Vitals and nursing note reviewed.  Constitutional:      General: She is not in acute distress.    Appearance: Normal appearance.  HENT:     Head: Normocephalic and atraumatic.     Mouth/Throat:     Lips: Pink.     Mouth: Mucous membranes are moist.     Dentition: Dental caries and dental abscesses present.     Tongue: No lesions. Tongue does not deviate from midline.     Palate: No mass and lesions.     Pharynx: Oropharynx is clear. Uvula midline.     Tonsils: No tonsillar exudate. 0 on the right. 0 on the left.   Eyes:     Pupils: Pupils are equal, round, and reactive to light.  Neck:     Thyroid: No thyroid mass, thyromegaly or thyroid tenderness.     Trachea: Trachea normal. No tracheal tenderness, tracheostomy or tracheal deviation.   Cardiovascular:     Rate and Rhythm: Normal rate and regular rhythm.  Pulmonary:     Effort: Pulmonary effort is normal.     Breath sounds: Normal breath sounds.  Musculoskeletal:        General: Normal range of motion.     Cervical back: Full passive range of motion without pain, normal range of motion and neck supple. Edema (Left side of neck) present.  Lymphadenopathy:     Cervical: Cervical adenopathy present.  Skin:    General: Skin is warm and dry.  Neurological:     General: No focal deficit present.     Mental Status: She is alert and oriented to person, place, and time. Mental status is at baseline.  Psychiatric:        Mood and Affect: Mood normal.        Behavior: Behavior normal.        Thought Content: Thought content normal.        Judgment: Judgment normal.     Visual Acuity Right Eye Distance:   Left Eye Distance:   Bilateral Distance:    Right Eye Near:   Left Eye Near:    Bilateral Near:     UC Couse / Diagnostics / Procedures:     Radiology No results found.  Procedures Procedures (including critical care time) EKG  Pending results:  Labs Reviewed - No data to display  Medications Ordered in UC: Medications - No data to display  UC Diagnoses / Final Clinical Impressions(s)   I have reviewed the triage vital signs and the nursing notes.  Pertinent labs & imaging results that were available during my care of the patient were reviewed by me and considered in my medical decision making (see chart for details).    Final diagnoses:  Dental abscess  Peritonsillar cellulitis   Patient was provided with 10-day course of Augmentin along with Diflucan for the inevitable vaginal yeast infection caused by 10 days of antibiotics.  Return precautions advised.  ED Prescriptions     Medication Sig Dispense Auth. Provider   fluconazole (DIFLUCAN) 150 MG tablet Take 1 tablet today.  Take second tablet 3 days later.  Take  third tablet 3 days after second  tablet. 3 tablet Theadora Rama Scales, PA-C   amoxicillin-clavulanate (AUGMENTIN) 875-125 MG tablet Take 1 tablet by mouth 2 (two) times daily for 10 days. 20 tablet Theadora Rama Scales, PA-C      PDMP not reviewed this encounter.  Pending results:  Labs Reviewed - No data to display  Discharge Instructions:   Discharge Instructions      I have sent 2 prescriptions to your pharmacy.  The first is Augmentin that I want you to take twice daily for 10 days.  The second is Diflucan that you take once every 3 days.  I have outlined your schedule for you:  Begin Augmentin this evening, your last dose will be in the morning of September 5.    You will take your first Diflucan tablet on August 28 in the evening, the second Diflucan tablet on August 31 in the evening, and the third Diflucan tablet will be taken on September 3 in the evening.  Diflucan remains in your system for 3 days so you will continue to have coverage of yeast infection up until 1 day after you finish your Augmentin.  Thank you for visiting urgent care today.      Disposition Upon Discharge:  Condition: stable for discharge home  Patient presented with an acute illness with associated systemic symptoms and significant discomfort requiring urgent management. In my opinion, this is a condition that a prudent lay person (someone who possesses an average knowledge of health and medicine) may potentially expect to result in complications if not addressed urgently such as respiratory distress, impairment of bodily function or dysfunction of bodily organs.   Routine symptom specific, illness specific and/or disease specific instructions were discussed with the patient and/or caregiver at length.   As such, the patient has been evaluated and assessed, work-up was performed and treatment was provided in alignment with urgent care protocols and evidence based medicine.  Patient/parent/caregiver has been advised that the  patient may require follow up for further testing and treatment if the symptoms continue in spite of treatment, as clinically indicated and appropriate.  Patient/parent/caregiver has been advised to return to the [facility] or PCP if no better; to PCP or the Emergency Department if new signs and symptoms develop, or if the current signs or symptoms continue to change or worsen for further workup, evaluation and treatment as clinically indicated and appropriate  The patient will follow up with their current PCP if and as advised. If the patient does not currently have a PCP we will assist them in obtaining one.   The patient may need specialty follow up if the symptoms continue, in spite of conservative treatment and management, for further workup, evaluation, consultation and treatment as clinically indicated and appropriate.   Patient/parent/caregiver verbalized understanding and agreement of plan as discussed.  All questions were addressed during visit.  Please see discharge instructions below for further details of plan.  This office note has been dictated using Teaching laboratory technician.  Unfortunately, this method of dictation can sometimes lead to typographical or grammatical errors.  I apologize for your inconvenience in advance if this occurs.  Please do not hesitate to reach out to me if clarification is needed.      Theadora Rama Scales, PA-C 06/23/22 1624

## 2022-06-23 NOTE — Discharge Instructions (Signed)
I have sent 2 prescriptions to your pharmacy.  The first is Augmentin that I want you to take twice daily for 10 days.  The second is Diflucan that you take once every 3 days.  I have outlined your schedule for you:  Begin Augmentin this evening, your last dose will be in the morning of September 5.    You will take your first Diflucan tablet on August 28 in the evening, the second Diflucan tablet on August 31 in the evening, and the third Diflucan tablet will be taken on September 3 in the evening.  Diflucan remains in your system for 3 days so you will continue to have coverage of yeast infection up until 1 day after you finish your Augmentin.  Thank you for visiting urgent care today.

## 2022-06-23 NOTE — ED Triage Notes (Signed)
The pt states she has had teeth removed to left upper teeth (Tuesday).  She c/o ear pain, dizziness, nausea, fever, and swelling to her jaws.   Started: Thursday

## 2022-06-25 ENCOUNTER — Encounter: Payer: Self-pay | Admitting: Nurse Practitioner

## 2022-06-25 ENCOUNTER — Ambulatory Visit (INDEPENDENT_AMBULATORY_CARE_PROVIDER_SITE_OTHER): Payer: Commercial Managed Care - HMO | Admitting: Nurse Practitioner

## 2022-06-25 ENCOUNTER — Ambulatory Visit: Payer: Commercial Managed Care - HMO | Admitting: Orthopedic Surgery

## 2022-06-25 VITALS — BP 124/84 | HR 72 | Temp 98.6°F | Wt 188.2 lb

## 2022-06-25 DIAGNOSIS — M65311 Trigger thumb, right thumb: Secondary | ICD-10-CM

## 2022-06-25 DIAGNOSIS — M1811 Unilateral primary osteoarthritis of first carpometacarpal joint, right hand: Secondary | ICD-10-CM | POA: Insufficient documentation

## 2022-06-25 DIAGNOSIS — K1379 Other lesions of oral mucosa: Secondary | ICD-10-CM | POA: Insufficient documentation

## 2022-06-25 MED ORDER — BETAMETHASONE SOD PHOS & ACET 6 (3-3) MG/ML IJ SUSP
6.0000 mg | INTRAMUSCULAR | Status: AC | PRN
  Administered 2022-06-25: 6 mg via INTRA_ARTICULAR

## 2022-06-25 MED ORDER — LIDOCAINE HCL 1 % IJ SOLN
1.0000 mL | INTRAMUSCULAR | Status: AC | PRN
  Administered 2022-06-25: 1 mL

## 2022-06-25 NOTE — Progress Notes (Signed)
Office Visit Note   Patient: Anita Ball           Date of Birth: 08-24-1976           MRN: [id number] Visit Date: 06/25/2022              Requested by: Gerre Scull, NP [address] [place],  [place] [address] PCP: Gerre Scull, NP   Assessment & Plan: Visit Diagnoses:  1. Trigger thumb, right thumb   2. Arthritis of carpometacarpal (CMC) joint of right thumb     Plan: Discussed with patient that she has multiple issues going on with his thumb.  She has palpable and visible triggering of the right thumb.  We reviewed the nature of trigger finger as well as his diagnosis, prognosis, both conservative and surgical treatment options.  She also seems to have some very early arthritis of the thumb CMC joint with pain without crepitus and the [facility] grind test and recent x-ray evidence of very mild osteoarthritis.  We discussed the nature of CMC arthritis as well.  After discussion, patient like to try corticosteroid junction into the right thumb A1 pulley.  We reviewed the risks of corticosteroid injections.  I can see her back again as needed.  Follow-Up Instructions: No follow-ups on file.   Orders:  No orders of the defined types were placed in this encounter.  No orders of the defined types were placed in this encounter.     Procedures: Hand/UE Inj: R thumb A1 for trigger finger on 06/25/2022 10:54 AM Indications: tendon swelling and therapeutic Details: 25 G needle, volar approach Medications: 1 mL lidocaine 1 %; 6 mg betamethasone acetate-betamethasone sodium phosphate 6 (3-3) MG/ML Procedure, treatment alternatives, risks and benefits explained, specific risks discussed. Consent was given by the patient. Immediately prior to procedure a time out was called to verify the correct patient, procedure, equipment, support staff and site/side marked as required. Patient was prepped and draped in the usual sterile fashion.       Clinical Data: No additional  findings.   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  Is been going on for some months now.  She denies any injury to the thumb.  She describes uncomfortable clicking and catching of the right thumb IP joint with flexion.  She had no treatment for this so far.  She is also somewhat tender at the thumb A1 pulley.  She also describes mild pain at the base of the thumb and the thenar eminence that is worse with prolonged activity.  She says the base of her thumb will occasionally swell but then this resolves on its own.  She has used ice but is otherwise not had any treatment for so far.    Review of Systems   Objective: Vital Signs: LMP 06/12/2022 (Approximate)   Physical Exam Constitutional:      Appearance: Normal appearance.  Cardiovascular:     Rate and Rhythm: Normal rate.     Pulses: Normal pulses.  Pulmonary:     Effort: Pulmonary effort is normal.  Skin:    General: Skin is warm and dry.     Capillary Refill: Capillary refill takes less than 2 seconds.  Neurological:     Mental Status: She  is alert.     Right Hand Exam   Tenderness  Right hand tenderness location: TTP at volar thumb over A1 pulley and at [facility] joint.  Other  Erythema: absent Sensation: normal Pulse: present  Comments:  Full thumb ROM.  Palpable triggering of thumb over A1 pulley.  Mild discomfort w/ CMC grind test but without creptius.  No instability at MCP joint.  Full thumb ROM.       Specialty Comments:  No specialty comments available.  Imaging: No results found.   PMFS History: Patient Active Problem List   Diagnosis Date Noted   Trigger thumb, right thumb 06/25/2022   Arthritis of carpometacarpal [facility]) joint of right thumb 06/25/2022   Chronic pain of right thumb  06/06/2022   Anxiety 06/06/2022   IFG (impaired fasting glucose) 06/06/2022   Schizoaffective disorder, bipolar type ([facility]) 09/27/2016   Tobacco use 07/19/2014   Systolic murmur 07/19/2014   Past Medical History:  Diagnosis Date   Anxiety    Depression    Epilepsy ([facility])    "as a child"   Heart murmur    Seizures ([facility])     Family History  Problem Relation Age of Onset   Depression Mother    Diabetes Father    Cancer Father        colon & melanoma   Alcoholism Father    Diabetes Maternal Grandfather     Past Surgical History:  Procedure Laterality Date   FRACTURE SURGERY     Social History   Occupational History   Not on file  Tobacco Use   Smoking status: Every Day    Packs/day: 1.50    Types: Cigarettes   Smokeless tobacco: Never  Vaping Use   Vaping Use: Never used  Substance and Sexual Activity   Alcohol use: Yes    Comment: occasionally   Drug use: Yes    Types: Marijuana   Sexual activity: Yes

## 2022-06-25 NOTE — Assessment & Plan Note (Signed)
He is having recurrent mouth pain and swelling after having 2 teeth pulled a few weeks ago.  She was seen at urgent care and started on Augmentin and Diflucan.  Recommend that she continue the current regimen.  She can use heat or ice along her neck and jaw to help with the pain and discomfort.  Recommend that she keep her appointment with oral surgery tomorrow.

## 2022-06-25 NOTE — Progress Notes (Signed)
   Acute Office Visit  Subjective:     Patient ID: Anita Ball, female    DOB: 1976-08-20, 46 y.o.   MRN: [id number]  Chief Complaint  Patient presents with   Acute Visit    Pt c/o recurrent infection in mouth w/ pain, swelling and discomfort in RT jaw and neck x4 days    HPI Patient is in today for mouth pain, right neck pain and jaw pain for 4 days.  She states that she had 2 teeth pulled and the abscess had resolved, now she is having recurrent symptoms.  She went to urgent care on Saturday and was prescribed Augmentin and Diflucan.  She states that the swelling and pain has gone down some.  She has an appointment with oral surgery tomorrow.  She denies fevers.  ROS See pertinent positives and negatives per HPI.      Objective:    BP 124/84 (BP Location: Left Arm, Patient Position: Sitting, Cuff Size: Large)   Pulse 72   Temp 98.6 F (37 C) (Oral)   Wt 188 lb 3.2 oz (85.4 kg)   LMP 06/12/2022 (Approximate)   SpO2 100%   BMI 33.34 kg/m    Physical Exam Vitals and nursing note reviewed.  Constitutional:      General: She is not in acute distress.    Appearance: Normal appearance.  HENT:     Head: Normocephalic.     Mouth/Throat:     Dentition: Gingival swelling present. No dental abscesses.  Eyes:     Conjunctiva/sclera: Conjunctivae normal.  Pulmonary:     Effort: Pulmonary effort is normal.  Musculoskeletal:     Cervical back: Normal range of motion.  Skin:    General: Skin is warm.  Neurological:     General: No focal deficit present.     Mental Status: She is alert and oriented to person, place, and time.  Psychiatric:        Mood and Affect: Mood normal.        Behavior: Behavior normal.        Thought Content: Thought content normal.        Judgment: Judgment normal.      Assessment & Plan:   Problem List Items Addressed This Visit       Other   Mouth pain - Primary    He is having recurrent mouth pain and swelling after having 2 teeth  pulled a few weeks ago.  She was seen at urgent care and started on Augmentin and Diflucan.  Recommend that she continue the current regimen.  She can use heat or ice along her neck and jaw to help with the pain and discomfort.  Recommend that she keep her appointment with oral surgery tomorrow.       No orders of the defined types were placed in this encounter.   No follow-ups on file.  Gerre Scull, NP

## 2022-07-23 ENCOUNTER — Encounter: Payer: Self-pay | Admitting: Nurse Practitioner

## 2022-07-23 ENCOUNTER — Ambulatory Visit (INDEPENDENT_AMBULATORY_CARE_PROVIDER_SITE_OTHER): Payer: Commercial Managed Care - HMO | Admitting: Nurse Practitioner

## 2022-07-23 VITALS — BP 130/88 | HR 62 | Temp 96.5°F | Wt 184.6 lb

## 2022-07-23 DIAGNOSIS — Z1211 Encounter for screening for malignant neoplasm of colon: Secondary | ICD-10-CM

## 2022-07-23 DIAGNOSIS — Z720 Tobacco use: Secondary | ICD-10-CM

## 2022-07-23 DIAGNOSIS — M542 Cervicalgia: Secondary | ICD-10-CM | POA: Diagnosis not present

## 2022-07-23 DIAGNOSIS — R350 Frequency of micturition: Secondary | ICD-10-CM | POA: Diagnosis not present

## 2022-07-23 DIAGNOSIS — Z0001 Encounter for general adult medical examination with abnormal findings: Secondary | ICD-10-CM | POA: Diagnosis not present

## 2022-07-23 DIAGNOSIS — Z0000 Encounter for general adult medical examination without abnormal findings: Secondary | ICD-10-CM

## 2022-07-23 LAB — POCT URINALYSIS DIPSTICK
Bilirubin, UA: NEGATIVE
Blood, UA: POSITIVE
Glucose, UA: NEGATIVE
Ketones, UA: NEGATIVE
Leukocytes, UA: NEGATIVE
Nitrite, UA: NEGATIVE
Protein, UA: NEGATIVE
Spec Grav, UA: >=1.03 — AB (ref 1.010–1.025)
Urobilinogen, UA: 0.2 E.U./dL
pH, UA: 5.5 (ref 5.0–8.0)

## 2022-07-23 MED ORDER — AMOXICILLIN-POT CLAVULANATE 875-125 MG PO TABS: 1.0000 | ORAL_TABLET | Freq: Two times a day (BID) | ORAL | 0 refills | Status: DC

## 2022-07-23 NOTE — Assessment & Plan Note (Signed)
Recommend complete tobacco cessation. 

## 2022-07-23 NOTE — Patient Instructions (Signed)
It was great to see you!  Start augmentin twice a day for 10 days. I have placed a referral to ENT for ongoing and recurring symptoms. Make sure you are drinking plenty of fluids.   I have ordered the cologuard which will be sent to your house.   Let's follow-up in 3 months, sooner if you have concerns.  If a referral was placed today, you will be contacted for an appointment. Please note that routine referrals can sometimes take up to 3-4 weeks to process. Please call our office if you haven't heard anything after this time frame.  Take care,  Vance Peper, NP

## 2022-07-23 NOTE — Progress Notes (Signed)
BP 130/88 (BP Location: Right Arm, Cuff Size: Large)   Pulse 62   Temp (!) 96.5 F (35.8 C) (Temporal)   Wt 184 lb 9.6 oz (83.7 kg)   LMP 06/12/2022 (Approximate)   SpO2 97%   BMI 32.70 kg/m    Subjective:    Patient ID: Anita Ball, female    DOB: September 02, 1976, 46 y.o.   MRN: [id number]  CC: Chief Complaint  Patient presents with   Annual Exam    CPE. Pt is fasting. Pt states possible recurrent infection in bladder w/ hematuria, and urinary frequency. Pt c/o pain in neck w/ ear ache for several days.    HPI: Anita Ball is a 46 y.o. female presenting on 07/23/2022 for comprehensive medical examination. Current medical complaints include: neck pain, headache, and fevers  She has left sided neck pain and swelling that has returned for the past few weeks. She has been experiencing fevers intermittently and nausea and headaches. She also recently had several teeth pulled recently. She recently finished antibiotics about 3-4 weeks ago. Looking down makes the pain worse. She also feels throbbing at times. She denies drooling, unable to maintain secretions. She has been taking advil.   She has also been experiencing urinary frequency, blood in urine and dysuria. She does have some odor. She denies pelvic and back pain. She is experiencing fevers, however she is also having left sided neck pain as noted above.   She currently lives with: alone Menopausal Symptoms: hot flashes   Recent Depression Screen:     06/06/2022    1:27 PM 08/17/2017    2:50 PM  Depression screen PHQ 2/9  Decreased Interest 2 0  Down, Depressed, Hopeless 1 0  PHQ - 2 Score 3 0  Altered sleeping 1   Tired, decreased energy 3   Change in appetite 0   Feeling bad or failure about yourself  0   Trouble concentrating 3   Moving slowly or fidgety/restless 3   Suicidal thoughts 0   PHQ-9 Score 13   Difficult doing work/chores Somewhat difficult     The patient does not have a history of falls. I  did not complete a risk assessment for falls. A plan of care for falls was not documented.   Past Medical History:  Past Medical History:  Diagnosis Date   Anxiety    Depression    Epilepsy (HCC)    "as a child"   Heart murmur    Seizures (HCC)     Surgical History:  Past Surgical History:  Procedure Laterality Date   FRACTURE SURGERY      Medications:  Current Outpatient Medications on File Prior to Visit  Medication Sig   fluconazole (DIFLUCAN) 150 MG tablet Take 1 tablet today.  Take second tablet 3 days later.  Take third tablet 3 days after second tablet.   hydrOXYzine (ATARAX) 10 MG tablet Take 1 tablet (10 mg total) by mouth 3 (three) times daily as needed.   No current facility-administered medications on file prior to visit.    Allergies:  No Known Allergies  Social History:  Social History   Socioeconomic History   Marital status: Widowed    Spouse name: Not on file   Number of children: Not on file   Years of education: Not on file   Highest education level: Not on file  Occupational History   Not on file  Tobacco Use   Smoking status: Every Day    Packs/day:  1.50    Types: Cigarettes   Smokeless tobacco: Never  Vaping Use   Vaping Use: Never used  Substance and Sexual Activity   Alcohol use: Yes    Comment: occasionally   Drug use: Yes    Types: Marijuana   Sexual activity: Yes  Other Topics Concern   Not on file  Social History Narrative   Not on file   Social Determinants of Health   Financial Resource Strain: Not on file  Food Insecurity: Not on file  Transportation Needs: Not on file  Physical Activity: Not on file  Stress: Not on file  Social Connections: Not on file  Intimate Partner Violence: Not on file   Social History   Tobacco Use  Smoking Status Every Day   Packs/day: 1.50   Types: Cigarettes  Smokeless Tobacco Never   Social History   Substance and Sexual Activity  Alcohol Use Yes   Comment: occasionally     Family History:  Family History  Problem Relation Age of Onset   Depression Mother    Diabetes Father    Cancer Father        prostate & melanoma   Alcoholism Father    Diabetes Maternal Grandfather     Past medical history, surgical history, medications, allergies, family history and social history reviewed with patient today and changes made to appropriate areas of the chart.   Review of Systems  Constitutional:  Positive for fever and malaise/fatigue.  HENT:  Positive for congestion, sinus pain and sore throat.   Eyes: Negative.   Respiratory:  Positive for shortness of breath (intermittent at times).   Cardiovascular: Negative.   Gastrointestinal: Negative.   Genitourinary:  Positive for dysuria, frequency, hematuria and urgency. Negative for flank pain.  Musculoskeletal:  Positive for neck pain.  Skin: Negative.   Neurological:  Positive for dizziness (intermittent) and headaches.  Psychiatric/Behavioral: Negative.     All other ROS negative except what is listed above and in the HPI.      Objective:    BP 130/88 (BP Location: Right Arm, Cuff Size: Large)   Pulse 62   Temp (!) 96.5 F (35.8 C) (Temporal)   Wt 184 lb 9.6 oz (83.7 kg)   LMP 06/12/2022 (Approximate)   SpO2 97%   BMI 32.70 kg/m   Wt Readings from Last 3 Encounters:  07/23/22 184 lb 9.6 oz (83.7 kg)  06/25/22 188 lb 3.2 oz (85.4 kg)  06/06/22 188 lb 6.4 oz (85.5 kg)    Physical Exam Vitals and nursing note reviewed.  Constitutional:      General: She is not in acute distress.    Appearance: Normal appearance.  HENT:     Head: Normocephalic and atraumatic.     Right Ear: Tympanic membrane, ear canal and external ear normal.     Left Ear: Tympanic membrane, ear canal and external ear normal.     Mouth/Throat:     Pharynx: No oropharyngeal exudate or posterior oropharyngeal erythema.  Eyes:     Conjunctiva/sclera: Conjunctivae normal.  Cardiovascular:     Rate and Rhythm: Normal rate and  regular rhythm.     Pulses: Normal pulses.     Heart sounds: Normal heart sounds.  Pulmonary:     Effort: Pulmonary effort is normal.     Breath sounds: Normal breath sounds.  Abdominal:     Palpations: Abdomen is soft. There is no mass.     Tenderness: There is no abdominal tenderness. There is no  right CVA tenderness or left CVA tenderness.  Musculoskeletal:        General: Normal range of motion.     Cervical back: Normal range of motion and neck supple. Tenderness present.     Right lower leg: No edema.     Left lower leg: No edema.  Lymphadenopathy:     Cervical: Cervical adenopathy (anterior) present.  Skin:    General: Skin is warm and dry.  Neurological:     General: No focal deficit present.     Mental Status: She is alert and oriented to person, place, and time.     Cranial Nerves: No cranial nerve deficit.     Coordination: Coordination normal.     Gait: Gait normal.  Psychiatric:        Mood and Affect: Mood normal.        Behavior: Behavior normal.        Thought Content: Thought content normal.        Judgment: Judgment normal.     Results for orders placed or performed in visit on 07/23/22  POCT Urinalysis Dipstick  Result Value Ref Range   Color, UA YELLOW    Clarity, UA CLOUDY    Glucose, UA Negative Negative   Bilirubin, UA NEG    Ketones, UA NEG    Spec Grav, UA >=1.030 (A) 1.010 - 1.025   Blood, UA POS    pH, UA 5.5 5.0 - 8.0   Protein, UA Negative Negative   Urobilinogen, UA 0.2 0.2 or 1.0 E.U./dL   Nitrite, UA neg    Leukocytes, UA Negative Negative   Appearance     Odor        Assessment & Plan:   Problem List Items Addressed This Visit       Other   Tobacco use    Recommend complete tobacco cessation.      Neck pain    She has been experiencing intermittent fevers, left sided neck swelling and pain.  Tonsils are 2+ bilaterally.  She does have a recent history of peritonsillar cellulitis, concern for possibly returning.  We will  have her start Augmentin twice a day for 10 days.  Referral placed to ENT.  She can continue to take ibuprofen or Tylenol as needed for fever and pain.  ER precautions discussed.      Relevant Orders   Ambulatory referral to ENT   Other Visit Diagnoses     Routine general medical examination at a health care facility    -  Primary   Health maintenance reviewed and updated. Declines td. With fevers will hold off on flu, pna. Discussed nutrition, exercise. F/U 1 year   Urinary frequency       U/A shows 2+ blood. With symtpoms, will send for culture. Drink plenty of fluids.    Relevant Orders   POCT Urinalysis Dipstick (Completed)   Urine Culture   Screen for colon cancer       Colon cancer screening options discussed and she opts for cologuard. Order placed today.    Relevant Orders   Cologuard        Follow up plan: Return in about 3 months (around 10/22/2022) for follow-up.   LABORATORY TESTING:  - Pap smear: up to date  IMMUNIZATIONS:   - Tdap: Tetanus vaccination status reviewed: tetanus status unknown to the patient. Declines booster today - Influenza: Refused - Pneumovax: Not applicable - Prevnar: Not applicable - HPV: Not applicable - Zostavax vaccine: Not  applicable  SCREENING: -Mammogram:  scheduled    - Colonoscopy: Ordered today  - Bone Density: Not applicable  -Hearing Test: Not applicable  -Spirometry: Not applicable   PATIENT COUNSELING:   Advised to take 1 mg of folate supplement per day if capable of pregnancy.   Sexuality: Discussed sexually transmitted diseases, partner selection, use of condoms, avoidance of unintended pregnancy  and contraceptive alternatives.   Advised to avoid cigarette smoking.  I discussed with the patient that most people either abstain from alcohol or drink within safe limits (<=14/week and <=4 drinks/occasion for males, <=7/weeks and <= 3 drinks/occasion for females) and that the risk for alcohol disorders and other health  effects rises proportionally with the number of drinks per week and how often a drinker exceeds daily limits.  Discussed cessation/primary prevention of drug use and availability of treatment for abuse.   Diet: Encouraged to adjust caloric intake to maintain  or achieve ideal body weight, to reduce intake of dietary saturated fat and total fat, to limit sodium intake by avoiding high sodium foods and not adding table salt, and to maintain adequate dietary potassium and calcium preferably from fresh fruits, vegetables, and low-fat dairy products.    stressed the importance of regular exercise  Injury prevention: Discussed safety belts, safety helmets, smoke detector, smoking near bedding or upholstery.   Dental health: Discussed importance of regular tooth brushing, flossing, and dental visits.    NEXT PREVENTATIVE PHYSICAL DUE IN 1 YEAR. Return in about 3 months (around 10/22/2022) for follow-up.

## 2022-07-23 NOTE — Assessment & Plan Note (Signed)
She has been experiencing intermittent fevers, left sided neck swelling and pain.  Tonsils are 2+ bilaterally.  She does have a recent history of peritonsillar cellulitis, concern for possibly returning.  We will have her start Augmentin twice a day for 10 days.  Referral placed to ENT.  She can continue to take ibuprofen or Tylenol as needed for fever and pain.  ER precautions discussed.

## 2022-07-26 LAB — URINE CULTURE
MICRO NUMBER:: 13963298
SPECIMEN QUALITY:: ADEQUATE

## 2022-07-30 LAB — HM MAMMOGRAPHY

## 2022-10-13 NOTE — Progress Notes (Unsigned)
   Established Patient Office Visit  Subjective   Patient ID: Anita Ball, female    DOB: 30-Jan-1976  Age: 46 y.o. MRN: [id number]  No chief complaint on file.   HPI  Anita Ball is here to follow-up   {History (Optional):23778}  ROS    Objective:     There were no vitals taken for this visit. {Vitals History (Optional):23777}  Physical Exam   No results found for any visits on 10/15/22.  {Labs (Optional):23779}  The 10-year ASCVD risk score (Arnett DK, et al., 2019) is: 3.1%    Assessment & Plan:   Problem List Items Addressed This Visit   None   No follow-ups on file.    Gerre Scull, NP

## 2022-10-15 ENCOUNTER — Encounter: Payer: Self-pay | Admitting: Nurse Practitioner

## 2022-10-15 ENCOUNTER — Ambulatory Visit (INDEPENDENT_AMBULATORY_CARE_PROVIDER_SITE_OTHER): Payer: Commercial Managed Care - HMO | Admitting: Nurse Practitioner

## 2022-10-15 VITALS — BP 124/80 | HR 74 | Temp 97.5°F | Ht 63.0 in | Wt 186.5 lb

## 2022-10-15 DIAGNOSIS — R0683 Snoring: Secondary | ICD-10-CM

## 2022-10-15 DIAGNOSIS — R6884 Jaw pain: Secondary | ICD-10-CM | POA: Diagnosis not present

## 2022-10-15 DIAGNOSIS — M542 Cervicalgia: Secondary | ICD-10-CM | POA: Diagnosis not present

## 2022-10-15 LAB — HM HEPATITIS C SCREENING LAB: HM Hepatitis Screen: NEGATIVE

## 2022-10-15 LAB — HM HIV SCREENING LAB: HM HIV Screening: NEGATIVE

## 2022-10-15 NOTE — Patient Instructions (Signed)
It was great to see you!  You can get a mouth at [facility] or [facility] to wear at night.   Let's follow-up in 9 months, sooner if you have concerns.  If a referral was placed today, you will be contacted for an appointment. Please note that routine referrals can sometimes take up to 3-4 weeks to process. Please call our office if you haven't heard anything after this time frame.  Take care,  Rodman Pickle, NP

## 2022-10-15 NOTE — Assessment & Plan Note (Signed)
She has been experiencing some jaw pain and states that she clenches her jaw frequently at night.  She has some headaches on the right side as well.  Discussed that she can wear a mouthguard at night which she can get over-the-counter or follow-up with her dentist to can do a fitted one.  Discussed doing yoga, exercise, meditation to help with anxiety and stress.  Follow-up if symptoms worsen or do not improve.

## 2022-10-15 NOTE — Assessment & Plan Note (Signed)
Neck pain has improved since her infection went away and completing the antibiotics.

## 2022-10-15 NOTE — Assessment & Plan Note (Signed)
She notes that she sometimes snores at night and will wake up gasping for breath.  She states that this is only happened a few times in the last time was a month ago.  She declines referral to sleep medicine for sleep study today, however if it still keeps going she would like to do this in the future.  Discussed if she does have sleep apnea, that this can lead towards other chronic health conditions.

## 2022-10-24 ENCOUNTER — Emergency Department (HOSPITAL_BASED_OUTPATIENT_CLINIC_OR_DEPARTMENT_OTHER): Payer: Commercial Managed Care - HMO

## 2022-10-24 ENCOUNTER — Emergency Department (HOSPITAL_BASED_OUTPATIENT_CLINIC_OR_DEPARTMENT_OTHER)
Admission: EM | Admit: 2022-10-24 | Discharge: 2022-10-24 | Disposition: A | Discharge status: Home / Self Care | Payer: Commercial Managed Care - HMO | Attending: Emergency Medicine | Admitting: Emergency Medicine

## 2022-10-24 ENCOUNTER — Encounter (HOSPITAL_BASED_OUTPATIENT_CLINIC_OR_DEPARTMENT_OTHER): Payer: Self-pay

## 2022-10-24 ENCOUNTER — Other Ambulatory Visit: Payer: Self-pay

## 2022-10-24 ENCOUNTER — Telehealth: Payer: Self-pay | Admitting: Nurse Practitioner

## 2022-10-24 DIAGNOSIS — U071 COVID-19: Secondary | ICD-10-CM | POA: Insufficient documentation

## 2022-10-24 DIAGNOSIS — R509 Fever, unspecified: Secondary | ICD-10-CM | POA: Diagnosis present

## 2022-10-24 LAB — CBC WITH DIFFERENTIAL/PLATELET
Abs Immature Granulocytes: 0.01 10*3/uL (ref 0.00–0.07)
Basophils Absolute: 0 10*3/uL (ref 0.0–0.1)
Basophils Relative: 1 %
Eosinophils Absolute: 0 10*3/uL (ref 0.0–0.5)
Eosinophils Relative: 1 %
HCT: 46 % (ref 36.0–46.0)
Hemoglobin: 15.7 g/dL — ABNORMAL HIGH (ref 12.0–15.0)
Immature Granulocytes: 0 %
Lymphocytes Relative: 47 %
Lymphs Abs: 2 10*3/uL (ref 0.7–4.0)
MCH: 30.9 pg (ref 26.0–34.0)
MCHC: 34.1 g/dL (ref 30.0–36.0)
MCV: 90.6 fL (ref 80.0–100.0)
Monocytes Absolute: 0.5 10*3/uL (ref 0.1–1.0)
Monocytes Relative: 12 %
Neutro Abs: 1.7 10*3/uL (ref 1.7–7.7)
Neutrophils Relative %: 39 %
Platelets: 163 10*3/uL (ref 150–400)
RBC: 5.08 MIL/uL (ref 3.87–5.11)
RDW: 13.4 % (ref 11.5–15.5)
WBC: 4.3 10*3/uL (ref 4.0–10.5)
nRBC: 0 % (ref 0.0–0.2)

## 2022-10-24 LAB — BASIC METABOLIC PANEL
Anion gap: 9 (ref 5–15)
BUN: 7 mg/dL (ref 6–20)
CO2: 27 mmol/L (ref 22–32)
Calcium: 9 mg/dL (ref 8.9–10.3)
Chloride: 103 mmol/L (ref 98–111)
Creatinine, Ser: 0.78 mg/dL (ref 0.44–1.00)
GFR, Estimated: >60 mL/min (ref >60)
Glucose, Bld: 101 mg/dL — ABNORMAL HIGH (ref 70–99)
Potassium: 3.6 mmol/L (ref 3.5–5.1)
Sodium: 139 mmol/L (ref 135–145)

## 2022-10-24 LAB — RESP PANEL BY RT-PCR (RSV, FLU A&B, COVID)  RVPGX2
Influenza A by PCR: NEGATIVE
Influenza B by PCR: NEGATIVE
Resp Syncytial Virus by PCR: NEGATIVE
SARS Coronavirus 2 by RT PCR: POSITIVE — AB

## 2022-10-24 NOTE — ED Provider Notes (Signed)
[facility] EMERGENCY DEPT Provider Note   CSN: [id number] Arrival date & time: 10/24/22  1312     History  Chief Complaint  Patient presents with   Fever    Anita Ball is a 46 y.o. female with a past medical history of anxiety presenting to the emergency room for evaluation of flulike symptoms.  Patient reports she has had fever, nausea, vomiting for 3 days.  She reports some shortness of breath with chest discomfort since yesterday.  She tested positive for COVID 3 days ago.   Fever   Past Medical History:  Diagnosis Date   Anxiety    Depression    Epilepsy (HCC)    "as a child"   Heart murmur    Seizures (HCC)    Past Surgical History:  Procedure Laterality Date   FRACTURE SURGERY       Home Medications Prior to Admission medications   Not on File      Allergies    Patient has no known allergies.    Review of Systems   Review of Systems  Constitutional:  Positive for fever.    Physical Exam Updated Vital Signs BP 123/88   Pulse 75   Temp 99.1 F (37.3 C)   Resp 18   Ht 5\' 3"  (1.6 m)   Wt 84.6 kg   SpO2 100%   BMI 33.04 kg/m  Physical Exam Vitals and nursing note reviewed.  Constitutional:      Appearance: Normal appearance.  HENT:     Head: Normocephalic and atraumatic.     Mouth/Throat:     Mouth: Mucous membranes are moist.  Eyes:     General: No scleral icterus. Cardiovascular:     Rate and Rhythm: Normal rate and regular rhythm.     Pulses: Normal pulses.     Heart sounds: Normal heart sounds.  Pulmonary:     Effort: Pulmonary effort is normal.     Breath sounds: Normal breath sounds.  Abdominal:     General: Abdomen is flat.     Palpations: Abdomen is soft.     Tenderness: There is no abdominal tenderness.  Musculoskeletal:        General: No deformity.  Skin:    General: Skin is warm.     Findings: No rash.  Neurological:     General: No focal deficit present.     Mental Status: She is alert.   Psychiatric:        Mood and Affect: Mood normal.     ED Results / Procedures / Treatments   Labs (all labs ordered are listed, but only abnormal results are displayed) Labs Reviewed  RESP PANEL BY RT-PCR (RSV, FLU A&B, COVID)  RVPGX2 - Abnormal; Notable for the following components:      Result Value   SARS Coronavirus 2 by RT PCR POSITIVE (*)    All other components within normal limits  BASIC METABOLIC PANEL - Abnormal; Notable for the following components:   Glucose, Bld 101 (*)    All other components within normal limits  CBC WITH DIFFERENTIAL/PLATELET - Abnormal; Notable for the following components:   Hemoglobin 15.7 (*)    All other components within normal limits    EKG None  Radiology DG Chest 2 View  Result Date: 10/24/2022 CLINICAL DATA:  Shortness of breath.  COVID positive. EXAM: CHEST - 2 VIEW COMPARISON:  Chest radiographs 05/12/2022 FINDINGS: The cardiomediastinal silhouette is unchanged with normal heart size. No airspace consolidation, edema,  pleural effusion, or pneumothorax is identified. No acute osseous abnormality is seen. IMPRESSION: No active cardiopulmonary disease. Electronically Signed   By: Sebastian Ache M.D.   On: 10/24/2022 14:30    Procedures Procedures    Medications Ordered in ED Medications - No data to display  ED Course/ Medical Decision Making/ A&P                           Medical Decision Making Amount and/or Complexity of Data Reviewed Labs: ordered. Radiology: ordered.   This patient presents to the ED for sore throat, body aches, this involves an extensive number of treatment options, and is a complaint that carries with a high risk of complications and morbidity.  The differential diagnosis includes flu, COVID, RSV, strep, pharyngitis, bronchitis, pneumonia, infectious etiology.  This is not an exhaustive list.  Lab tests: I ordered and personally interpreted labs.  The pertinent results include: Viral panel positive for  covid.  Imaging studies:  Problem list/ ED course/ Critical interventions/ Medical management: HPI: See above Vital signs within normal range and stable throughout visit. Laboratory/imaging studies significant for: See above. On physical examination, patient is afebrile and appears in no acute distress. This patient presents with symptoms suspicious for covid. Based on history and physical doubt sinusitis. COVID test was positive. Do not suspect underlying cardiopulmonary process. I considered, but think unlikely, pneumonia. Patient is nontoxic appearing and not in need of emergent medical intervention. Patient told to self isolate at home until symptoms subside for 72 hours.  Recommended patient to take TheraFlu or Mucinex for symptom relief.  Follow-up with primary care physician for further evaluation and management.  Return to the ER if new or worsening symptoms. I have reviewed the patient home medicines and have made adjustments as needed.  Cardiac monitoring/EKG: The patient was maintained on a cardiac monitor.  I personally reviewed and interpreted the cardiac monitor which showed an underlying rhythm of: sinus rhythm.  Additional history obtained: External records from outside source obtained and reviewed including: Chart review including previous notes, labs, imaging.  Consultations obtained:  Disposition Continued outpatient therapy. Follow-up with PCP recommended for reevaluation of symptoms. Treatment plan discussed with patient.  Pt acknowledged understanding was agreeable to the plan. Worrisome signs and symptoms were discussed with patient, and patient acknowledged understanding to return to the ED if they noticed these signs and symptoms. Patient was stable upon discharge.   This chart was dictated using voice recognition software.  Despite best efforts to proofread,  errors can occur which can change the documentation meaning.          Final Clinical Impression(s) / ED  Diagnoses Final diagnoses:  COVID    Rx / DC Orders ED Discharge Orders     None         Jeanelle Malling, [place] 10/24/22 2330    Jacalyn Lefevre, MD 10/31/22 [phone]

## 2022-10-24 NOTE — ED Notes (Signed)
Pt verbalizes understanding of all d/c instructions

## 2022-10-24 NOTE — ED Provider Triage Note (Signed)
Emergency Medicine Provider Triage Evaluation Note  Anita Ball , a 46 y.o. female  was evaluated in triage.  Pt complains of fever, nausea, vomiting x 3 days. Reports shortness of breath with chest discomfort since yesterday. Tested positive for Covid 3 days ago.  Review of Systems  Positive: As above Negative: As above  Physical Exam  BP (!) 125/99 (BP Location: Right Arm)   Pulse 88   Temp 98.7 F (37.1 C) (Oral)   Resp 20   Ht 5\' 3"  (1.6 m)   Wt 84.6 kg   SpO2 100%   BMI 33.04 kg/m  Gen:   Awake, no distress   Resp:  Normal effort  MSK:   Moves extremities without difficulty  Other:    Medical Decision Making  Medically screening exam initiated at 1:37 PM.  Appropriate orders placed.  Anita Ball was informed that the remainder of the evaluation will be completed by another provider, this initial triage assessment does not replace that evaluation, and the importance of remaining in the ED until their evaluation is complete.     [name], Jeanelle Malling 10/24/22 1338

## 2022-10-24 NOTE — Discharge Instructions (Addendum)
You tested positive for COVID today.  You can try Mucinex or TheraFlu for symptom relief.  Please take tylenol/ibuprofen for pain/fever. I recommend close follow-up with PCP for reevaluation.  Please do not hesitate to return to emergency department if worrisome signs symptoms we discussed become apparent.

## 2022-10-24 NOTE — ED Triage Notes (Signed)
Patient here POV from Home.  Endorses Fever, N/V that began 3 Days ago. States she has been becoming more SOB and Dizzy recently.   Tested Positive COVID-19 3 Days ago. Sent by PCP for Assessment.   NAD Noted during Triage. A&Ox4. GCS 15. Ambulatory.

## 2022-10-24 NOTE — Telephone Encounter (Signed)
FYI:Pt called in wanting to know when she should be seen. She has tested herself for covid and she has it. She told me she was sob, throwing up, is having chest pain and was dizzy. I transferred her over to nurse triage.

## 2022-10-25 ENCOUNTER — Telehealth: Payer: Self-pay

## 2022-10-25 NOTE — Telephone Encounter (Signed)
Transition Care Management Follow-up Telephone Call Date of discharge and from where: 10/24/22 [facility] How have you been since you were released from the hospital? I'm doing ok Any questions or concerns? No  Items Reviewed: Did the pt receive and understand the discharge instructions provided? Yes  Medications obtained and verified? No  Other? No  Any new allergies since your discharge? No  Dietary orders reviewed? No Do you have support at home? No    Functional Questionnaire: (I = Independent and D = Dependent) ADLs: I  Bathing/Dressing- I  Meal Prep- I  Eating- I  Maintaining continence- I  Transferring/Ambulation- I  Managing Meds- I  Follow up appointments reviewed:  PCP Hospital f/u appt confirmed? No  Scheduled to see N/A on N/A @ N/A. Pt declined. Specialist Hospital f/u appt confirmed? No  Scheduled to see N/A on N/A @ N/A. Are transportation arrangements needed? No  If their condition worsens, is the pt aware to call PCP or go to the Emergency Dept.? No Was the patient provided with contact information for the PCP's office or ED? No Was to pt encouraged to call back with questions or concerns? No   Arvil Persons, RN, Radiation protection practitioner

## 2022-12-06 ENCOUNTER — Encounter: Payer: Self-pay | Admitting: Family Medicine

## 2022-12-06 ENCOUNTER — Ambulatory Visit (INDEPENDENT_AMBULATORY_CARE_PROVIDER_SITE_OTHER): Payer: Commercial Managed Care - HMO | Admitting: Family Medicine

## 2022-12-06 VITALS — BP 130/84 | HR 70 | Temp 98.0°F | Ht 63.0 in | Wt 185.6 lb

## 2022-12-06 DIAGNOSIS — M542 Cervicalgia: Secondary | ICD-10-CM | POA: Diagnosis not present

## 2022-12-06 MED ORDER — IBUPROFEN 800 MG PO TABS: 800.0000 mg | ORAL_TABLET | Freq: Three times a day (TID) | ORAL | 0 refills | Status: DC | PRN

## 2022-12-06 NOTE — Patient Instructions (Signed)
Apply warm compresses to the left neck for 10-15 min. three times a day. Do stretches of the neck while using the heat. Take ibuprofen 800 mg with Tylenol (either 325 or 500 mg) four times a day. Follow-up with dentist as scheduled on Monday.

## 2022-12-06 NOTE — Assessment & Plan Note (Signed)
The etiology of this is unclear. I do not see visible sign of an active dental infection. I recommend she:  Apply warm compresses to the left neck for 10-15 min. three times a day. Do stretches of the neck while using the heat. Take ibuprofen 800 mg with Tylenol (either 325 or 500 mg) four times a day. Follow-up with dentist as scheduled on Monday.

## 2022-12-06 NOTE — Progress Notes (Signed)
[facility] [address] [place] [address] Dept: [phone] Dept Fax: [phone]  Office Visit  Subjective:    Patient ID: Anita Ball, female    DOB: 29-Mar-1976, 47 y.o..   MRN: [id number]  Chief Complaint  Patient presents with   Neck Pain    C/o having LT side neck pain.  Has taken Advil.    History of Present Illness:  Patient is in today complaining of pain in her left anterior neck over the past week. She notes that she has had tenderness in the tissues of the left anterior neck when she palpates this. She has not run any fever. She notes some increased discomfort when rotating her neck to the right. She had a similar issue in July, which turned out to be due to a dental abscess. She had some teeth pulled at that point. She notes some mild dental discomfort, but notes her use of ibuprofen is masking her symptoms currently. She does have a pending dental appointment on Monday for a cleaning.  Past Medical History: Patient Active Problem List   Diagnosis Date Noted   Anterior neck pain 12/06/2022   Snoring 10/15/2022   Jaw pain 10/15/2022   Neck pain 07/23/2022   Trigger thumb, right thumb 06/25/2022   Arthritis of carpometacarpal [facility]) joint of right thumb 06/25/2022   Mouth pain 06/25/2022   Chronic pain of right thumb 06/06/2022   Anxiety 06/06/2022   IFG (impaired fasting glucose) 06/06/2022   Schizoaffective disorder, bipolar type ([place]) 09/27/2016   Tobacco use 43/32/9518   Systolic murmur 84/16/6063   Past Surgical History:  Procedure Laterality Date   FRACTURE SURGERY     Family History  Problem Relation Age of Onset   Depression Mother    Diabetes Father    Cancer Father        prostate & melanoma   Alcoholism Father    Diabetes Maternal Grandfather    No outpatient medications prior to visit.   No facility-administered medications prior to visit.   No Known Allergies     Objective:   Today's Vitals   12/06/22 0920  BP: 130/84  Pulse: 70  Temp: 98 F (36.7 C)  TempSrc: Temporal  SpO2: 98%  Weight: 185 lb 9.6 oz (84.2 kg)  Height: 5\' 3"  (1.6 m)   Body mass index is 32.88 kg/m.   General: Well developed, well nourished. No acute distress. HEENT: Normocephalic, non-traumatic. Mucous membranes moist. Oropharynx clear. The gums show no sing of   acute infection and there is no tenderness with percussion over the lower left molars.. Neck: Supple. No lymphadenopathy. No thyromegaly. No palpable masses. No bruits noted. Psych: Alert and oriented. Normal mood and affect.  Health Maintenance Due  Topic Date Due   Fecal DNA (Cologuard)  Never done     Assessment & Plan:   Problem List Items Addressed This Visit       Other   Anterior neck pain - Primary    The etiology of this is unclear. I do not see visible sign of an active dental infection. I recommend she:  Apply warm compresses to the left neck for 10-15 min. three times a day. Do stretches of the neck while using the heat. Take ibuprofen 800 mg with Tylenol (either 325 or 500 mg) four times a day. Follow-up with dentist as scheduled on Monday.      Relevant Medications   ibuprofen (ADVIL) 800 MG tablet  Return if symptoms worsen or fail to improve.   Haydee Salter, MD

## 2023-03-01 ENCOUNTER — Encounter: Payer: Self-pay | Admitting: Nurse Practitioner

## 2023-03-01 ENCOUNTER — Ambulatory Visit (INDEPENDENT_AMBULATORY_CARE_PROVIDER_SITE_OTHER): Payer: Commercial Managed Care - HMO | Admitting: Nurse Practitioner

## 2023-03-01 VITALS — BP 128/88 | HR 73 | Temp 97.1°F | Ht 63.0 in | Wt 186.6 lb

## 2023-03-01 DIAGNOSIS — Z1211 Encounter for screening for malignant neoplasm of colon: Secondary | ICD-10-CM

## 2023-03-01 DIAGNOSIS — M542 Cervicalgia: Secondary | ICD-10-CM | POA: Diagnosis not present

## 2023-03-01 NOTE — Patient Instructions (Addendum)
It was great to see you!  Keep taking ibuprofen as needed for pain.   Start flonase and/or claritin daily as needed for sinus pressure.   I have placed a referral to ENT  Let's follow-up if your symptoms worsen or don't improve.   Take care,  Rodman Pickle, NP

## 2023-03-01 NOTE — Assessment & Plan Note (Signed)
She has been experiencing intermittent left anterior neck pain for the past several months.  She states that it started with a dental infection, however the infection has cleared up.  She has been taking ibuprofen which will help the pain, however then it comes back after a few days again.  Exam within normal limits today.  She can continue ibuprofen as needed for pain.  Will place referral to ENT with ongoing sinus pain, pressure and neck pain.  She can start Flonase nasal spray as needed for sinus pressure and pain.

## 2023-03-01 NOTE — Progress Notes (Signed)
Acute Office Visit  Subjective:     Patient ID: Anita Ball, female    DOB: 05/16/1976, 47 y.o.   MRN: [id number]  Chief Complaint  Patient presents with   Neck Pain    Reoccurring neck pain with swelling, sinus problems, facial pain    HPI Patient is in today for reoccurring anterior neck pain associated with sinus and facial pressure and pain.  She states that ever since she had the dental abscess she has still been having intermittent anterior neck pain that will sometimes radiate to her ear.  She was seen for this again in February and states that since then it is still happening every few days, however it does not last as long and is not as severe.  She states that the pain is on the left side of her anterior neck.  She went to the dentist and had x-rays of her she had her tooth pulled and it did not show any more signs of infection.  She states that she was told at one point that she had a cyst on her vocal cord.  She also notes intermittent sinus pressure and pain across her cheeks and forehead.  She denies fevers, nasal congestion, rhinorrhea, sore throat, trouble swallowing.  She has been taking ibuprofen which helps with the pain. She is not having any pain today.   ROS See pertinent positives and negatives per HPI.     Objective:    BP 128/88 (BP Location: Left Arm)   Pulse 73   Temp (!) 97.1 F (36.2 C)   Ht 5\' 3"  (1.6 m)   Wt 186 lb 9.6 oz (84.6 kg)   LMP 02/06/2023 (Exact Date)   SpO2 97%   BMI 33.05 kg/m    Physical Exam Vitals and nursing note reviewed.  Constitutional:      General: She is not in acute distress.    Appearance: Normal appearance.  HENT:     Head: Normocephalic.     Right Ear: Tympanic membrane, ear canal and external ear normal.     Left Ear: Tympanic membrane, ear canal and external ear normal.     Nose:     Right Sinus: No maxillary sinus tenderness or frontal sinus tenderness.     Left Sinus: No maxillary sinus tenderness or  frontal sinus tenderness.     Mouth/Throat:     Mouth: Mucous membranes are moist.     Pharynx: Oropharynx is clear. No oropharyngeal exudate or posterior oropharyngeal erythema.  Eyes:     Conjunctiva/sclera: Conjunctivae normal.  Neck:     Vascular: No carotid bruit.  Cardiovascular:     Rate and Rhythm: Normal rate and regular rhythm.     Pulses: Normal pulses.     Heart sounds: Normal heart sounds.  Pulmonary:     Effort: Pulmonary effort is normal.     Breath sounds: Normal breath sounds.  Musculoskeletal:     Cervical back: Normal range of motion and neck supple. No rigidity or tenderness.  Lymphadenopathy:     Cervical: No cervical adenopathy.  Skin:    General: Skin is warm.  Neurological:     General: No focal deficit present.     Mental Status: She is alert and oriented to person, place, and time.  Psychiatric:        Mood and Affect: Mood normal.        Behavior: Behavior normal.        Thought Content: Thought content normal.  Judgment: Judgment normal.      Assessment & Plan:   Problem List Items Addressed This Visit       Other   Anterior neck pain - Primary    She has been experiencing intermittent left anterior neck pain for the past several months.  She states that it started with a dental infection, however the infection has cleared up.  She has been taking ibuprofen which will help the pain, however then it comes back after a few days again.  Exam within normal limits today.  She can continue ibuprofen as needed for pain.  Will place referral to ENT with ongoing sinus pain, pressure and neck pain.  She can start Flonase nasal spray as needed for sinus pressure and pain.      Relevant Orders   Ambulatory referral to ENT   Other Visit Diagnoses     Screen for colon cancer       She would like a colonoscopy instead of cologuard, referral placed.   Relevant Orders   Ambulatory referral to Gastroenterology       No orders of the defined types  were placed in this encounter.   Return if symptoms worsen or fail to improve.  Gerre Scull, NP

## 2023-06-13 ENCOUNTER — Encounter (INDEPENDENT_AMBULATORY_CARE_PROVIDER_SITE_OTHER): Payer: Self-pay

## 2023-07-29 ENCOUNTER — Ambulatory Visit (INDEPENDENT_AMBULATORY_CARE_PROVIDER_SITE_OTHER): Payer: Commercial Managed Care - HMO | Admitting: Nurse Practitioner

## 2023-07-29 ENCOUNTER — Encounter: Payer: Self-pay | Admitting: Nurse Practitioner

## 2023-07-29 VITALS — BP 132/88 | HR 67 | Temp 97.3°F | Ht 63.0 in | Wt 181.8 lb

## 2023-07-29 DIAGNOSIS — M542 Cervicalgia: Secondary | ICD-10-CM | POA: Diagnosis not present

## 2023-07-29 DIAGNOSIS — J3489 Other specified disorders of nose and nasal sinuses: Secondary | ICD-10-CM | POA: Diagnosis not present

## 2023-07-29 DIAGNOSIS — F250 Schizoaffective disorder, bipolar type: Secondary | ICD-10-CM

## 2023-07-29 DIAGNOSIS — Z720 Tobacco use: Secondary | ICD-10-CM

## 2023-07-29 DIAGNOSIS — R238 Other skin changes: Secondary | ICD-10-CM

## 2023-07-29 DIAGNOSIS — Z0001 Encounter for general adult medical examination with abnormal findings: Secondary | ICD-10-CM | POA: Diagnosis not present

## 2023-07-29 DIAGNOSIS — E782 Mixed hyperlipidemia: Secondary | ICD-10-CM

## 2023-07-29 LAB — CBC WITH DIFFERENTIAL/PLATELET
Basophils Absolute: 0 10*3/uL (ref 0.0–0.1)
Basophils Relative: 0.7 % (ref 0.0–3.0)
Eosinophils Absolute: 0.2 10*3/uL (ref 0.0–0.7)
Eosinophils Relative: 2.4 % (ref 0.0–5.0)
HCT: 48.1 % — ABNORMAL HIGH (ref 36.0–46.0)
Hemoglobin: 16 g/dL — ABNORMAL HIGH (ref 12.0–15.0)
Lymphocytes Relative: 42 % (ref 12.0–46.0)
Lymphs Abs: 2.9 10*3/uL (ref 0.7–4.0)
MCHC: 33.3 g/dL (ref 30.0–36.0)
MCV: 95.3 fL (ref 78.0–100.0)
Monocytes Absolute: 0.6 10*3/uL (ref 0.1–1.0)
Monocytes Relative: 8.2 % (ref 3.0–12.0)
Neutro Abs: 3.2 10*3/uL (ref 1.4–7.7)
Neutrophils Relative %: 46.7 % (ref 43.0–77.0)
Platelets: 216 10*3/uL (ref 150.0–400.0)
RBC: 5.05 Mil/uL (ref 3.87–5.11)
RDW: 14 % (ref 11.5–15.5)
WBC: 6.8 10*3/uL (ref 4.0–10.5)

## 2023-07-29 LAB — COMPREHENSIVE METABOLIC PANEL
ALT: 24 U/L (ref 0–35)
AST: 20 U/L (ref 0–37)
Albumin: 4.4 g/dL (ref 3.5–5.2)
Alkaline Phosphatase: 39 U/L (ref 39–117)
BUN: 9 mg/dL (ref 6–23)
CO2: 26 meq/L (ref 19–32)
Calcium: 9.9 mg/dL (ref 8.4–10.5)
Chloride: 106 meq/L (ref 96–112)
Creatinine, Ser: 0.79 mg/dL (ref 0.40–1.20)
GFR: 89.84 mL/min (ref >60.00)
Glucose, Bld: 99 mg/dL (ref 70–99)
Potassium: 4.2 meq/L (ref 3.5–5.1)
Sodium: 140 meq/L (ref 135–145)
Total Bilirubin: 0.6 mg/dL (ref 0.2–1.2)
Total Protein: 6.7 g/dL (ref 6.0–8.3)

## 2023-07-29 LAB — LIPID PANEL
Cholesterol: 200 mg/dL (ref 0–200)
HDL: 55.2 mg/dL (ref >39.00)
LDL Cholesterol: 111 mg/dL — ABNORMAL HIGH (ref 0–99)
NonHDL: 145.25
Total CHOL/HDL Ratio: 4
Triglycerides: 173 mg/dL — ABNORMAL HIGH (ref 0.0–149.0)
VLDL: 34.6 mg/dL (ref 0.0–40.0)

## 2023-07-29 LAB — TSH: TSH: 0.73 u[IU]/mL (ref 0.35–5.50)

## 2023-07-29 MED ORDER — MUPIROCIN CALCIUM 2 % EX CREA
1.0000 | TOPICAL_CREAM | Freq: Two times a day (BID) | CUTANEOUS | 0 refills | Status: DC
Start: 2023-07-29 — End: 2024-02-27

## 2023-07-29 NOTE — Assessment & Plan Note (Signed)
Chronic, ongoing. She is still having throbbing neck pain, especially when turning her head to the left. She would like imaging of this. Will check MRI of her neck for further evaluation.

## 2023-07-29 NOTE — Assessment & Plan Note (Signed)
Chronic, stable.  Check CMP, CBC, lipid panel today and treat based on results.

## 2023-07-29 NOTE — Patient Instructions (Signed)
It was great to see you!  Start zyrtec or claritin over the  counter daily. Try to get the mold cleaned/removed as soon as possible.   I have ordered a MRI of your neck.   We are checking your labs today and will let you know the results via mychart/phone.   Let's follow-up after your MRI.   If a referral was placed today, you will be contacted for an appointment. Please note that routine referrals can sometimes take up to 3-4 weeks to process. Please call our office if you haven't heard anything after this time frame.  Take care,  Rodman Pickle, NP

## 2023-07-29 NOTE — Progress Notes (Signed)
BP 132/88 (BP Location: Left Arm)   Pulse 67   Temp (!) 97.3 F (36.3 C)   Ht 5\' 3"  (1.6 m)   Wt 181 lb 12.8 oz (82.5 kg)   LMP 07/03/2023 (Exact Date)   SpO2 97%   BMI 32.20 kg/m    Subjective:    Patient ID: Anita Ball, female    DOB: [birth year], 47 y.o.   MRN: [id number]  CC: Chief Complaint  Patient presents with   Annual Exam    Non fasting labs, sinus issues, Concerns with mold in house    HPI: Anita Ball is a 47 y.o. female presenting on 07/29/2023 for comprehensive medical examination. Current medical complaints include: ongoing neck pain and sinus pain  She states that she has been having ongoing left sided anterior neck pain. It is not as bad as it was. It started with a tooth infection, but has not gone away. She describes it as throbbing and gets worse when she turns her head. She is concerned she could have a mass in her neck.   She also notes that she recently found black mold in her house. She is trying to get rid of it, however she states her walls may need to be replaced. She is having some congestion, sinus pain on the left, and left sided headaches. She denies fevers and ear pain.   Menopausal Symptoms: no  Depression and Anxiety Screen done today and results listed below:     07/29/2023   10:02 AM 03/01/2023   11:05 AM 06/06/2022    1:27 PM 08/17/2017    2:50 PM  Depression screen PHQ 2/9  Decreased Interest 0 0 2 0  Down, Depressed, Hopeless 0 0 1 0  PHQ - 2 Score 0 0 3 0  Altered sleeping 2 1 1    Tired, decreased energy 2 0 3   Change in appetite 2 1 0   Feeling bad or failure about yourself  0 0 0   Trouble concentrating 0 1 3   Moving slowly or fidgety/restless 0 0 3   Suicidal thoughts 0 0 0   PHQ-9 Score 6 3 13    Difficult doing work/chores Not difficult at all Not difficult at all Somewhat difficult       07/29/2023   10:03 AM 03/01/2023   11:05 AM 06/06/2022    1:28 PM  GAD 7 : Generalized Anxiety Score  Nervous, Anxious, on  Edge 3 3 3   Control/stop worrying 0 0 0  Worry too much - different things 0 0 0  Trouble relaxing 3 3 3   Restless 0 1 3  Easily annoyed or irritable 1 1 3   Afraid - awful might happen 0 0 0  Total GAD 7 Score 7 8 12   Anxiety Difficulty Not difficult at all Not difficult at all Somewhat difficult    The patient does not have a history of falls. I did not complete a risk assessment for falls. A plan of care for falls was not documented.   Past Medical History:  Past Medical History:  Diagnosis Date   Anxiety    Depression    Epilepsy (HCC)    "as a child"   Heart murmur    Seizures (HCC)     Surgical History:  Past Surgical History:  Procedure Laterality Date   FRACTURE SURGERY      Medications:  Current Outpatient Medications on File Prior to Visit  Medication Sig   ibuprofen (ADVIL) 800  MG tablet Take 1 tablet (800 mg total) by mouth every 8 (eight) hours as needed. (Patient not taking: Reported on 07/29/2023)   No current facility-administered medications on file prior to visit.    Allergies:  No Known Allergies  Social History:  Social History   Socioeconomic History   Marital status: Widowed    Spouse name: Not on file   Number of children: Not on file   Years of education: Not on file   Highest education level: Not on file  Occupational History   Not on file  Tobacco Use   Smoking status: Every Day    Current packs/day: 2.00    Average packs/day: 2.0 packs/day for 32.7 years (65.5 ttl pk-yrs)    Types: Cigarettes    Start date: 48   Smokeless tobacco: Never  Vaping Use   Vaping status: Never Used  Substance and Sexual Activity   Alcohol use: Yes    Comment: occasionally   Drug use: Yes    Types: Marijuana   Sexual activity: Yes  Other Topics Concern   Not on file  Social History Narrative   Not on file   Social Determinants of Health   Financial Resource Strain: Not on file  Food Insecurity: Not on file  Transportation Needs: Not on  file  Physical Activity: Not on file  Stress: Not on file  Social Connections: Not on file  Intimate Partner Violence: Not on file   Social History   Tobacco Use  Smoking Status Every Day   Current packs/day: 2.00   Average packs/day: 2.0 packs/day for 32.7 years (65.5 ttl pk-yrs)   Types: Cigarettes   Start date: 76  Smokeless Tobacco Never   Social History   Substance and Sexual Activity  Alcohol Use Yes   Comment: occasionally    Family History:  Family History  Problem Relation Age of Onset   Depression Mother    Diabetes Father    Cancer Father        prostate & melanoma   Alcoholism Father    Diabetes Maternal Grandfather     Past medical history, surgical history, medications, allergies, family history and social history reviewed with patient today and changes made to appropriate areas of the chart.   Review of Systems  Constitutional:  Positive for malaise/fatigue. Negative for fever.  HENT:  Positive for congestion and sinus pain. Negative for hearing loss.   Eyes: Negative.   Respiratory: Negative.    Cardiovascular:  Positive for chest pain (better with stretching).  Gastrointestinal: Negative.   Genitourinary: Negative.   Musculoskeletal:  Positive for neck pain.  Skin: Negative.   Neurological:  Positive for headaches.  Psychiatric/Behavioral:  The patient is nervous/anxious.    All other ROS negative except what is listed above and in the HPI.      Objective:    BP 132/88 (BP Location: Left Arm)   Pulse 67   Temp (!) 97.3 F (36.3 C)   Ht 5\' 3"  (1.6 m)   Wt 181 lb 12.8 oz (82.5 kg)   LMP 07/03/2023 (Exact Date)   SpO2 97%   BMI 32.20 kg/m   Wt Readings from Last 3 Encounters:  07/29/23 181 lb 12.8 oz (82.5 kg)  03/01/23 186 lb 9.6 oz (84.6 kg)  12/06/22 185 lb 9.6 oz (84.2 kg)    Physical Exam Vitals and nursing note reviewed.  Constitutional:      General: She is not in acute distress.    Appearance: Normal  appearance.  HENT:      Head: Normocephalic and atraumatic.     Right Ear: Tympanic membrane, ear canal and external ear normal.     Left Ear: Tympanic membrane, ear canal and external ear normal.     Mouth/Throat:     Mouth: Mucous membranes are moist.     Pharynx: No posterior oropharyngeal erythema.  Eyes:     Conjunctiva/sclera: Conjunctivae normal.  Cardiovascular:     Rate and Rhythm: Normal rate and regular rhythm.     Pulses: Normal pulses.     Heart sounds: Normal heart sounds.  Pulmonary:     Effort: Pulmonary effort is normal.     Breath sounds: Normal breath sounds.  Abdominal:     Palpations: Abdomen is soft.     Tenderness: There is no abdominal tenderness.  Musculoskeletal:        General: Normal range of motion.     Cervical back: Normal range of motion and neck supple. Tenderness present.     Right lower leg: No edema.     Left lower leg: No edema.  Lymphadenopathy:     Cervical: No cervical adenopathy.  Skin:    General: Skin is warm and dry.     Findings: Lesion (small papular areas to inner knees bilaterally) present.  Neurological:     General: No focal deficit present.     Mental Status: She is alert and oriented to person, place, and time.     Cranial Nerves: No cranial nerve deficit.     Coordination: Coordination normal.     Gait: Gait normal.  Psychiatric:        Mood and Affect: Mood normal.        Behavior: Behavior normal.        Thought Content: Thought content normal.        Judgment: Judgment normal.     Results for orders placed or performed during the hospital encounter of 10/24/22  Resp panel by RT-PCR (RSV, Flu A&B, Covid) Anterior Nasal Swab   Specimen: Anterior Nasal Swab  Result Value Ref Range   SARS Coronavirus 2 by RT PCR POSITIVE (A) NEGATIVE   Influenza A by PCR NEGATIVE NEGATIVE   Influenza B by PCR NEGATIVE NEGATIVE   Resp Syncytial Virus by PCR NEGATIVE NEGATIVE  Basic metabolic panel  Result Value Ref Range   Sodium 139 135 - 145 mmol/L    Potassium 3.6 3.5 - 5.1 mmol/L   Chloride 103 98 - 111 mmol/L   CO2 27 22 - 32 mmol/L   Glucose, Bld 101 (H) 70 - 99 mg/dL   BUN 7 6 - 20 mg/dL   Creatinine, Ser 4.09 0.44 - 1.00 mg/dL   Calcium 9.0 8.9 - 81.1 mg/dL   GFR, Estimated >91 >47 mL/min   Anion gap 9 5 - 15  CBC with Differential  Result Value Ref Range   WBC 4.3 4.0 - 10.5 K/uL   RBC 5.08 3.87 - 5.11 MIL/uL   Hemoglobin 15.7 (H) 12.0 - 15.0 g/dL   HCT 82.9 56.2 - 13.0 %   MCV 90.6 80.0 - 100.0 fL   MCH 30.9 26.0 - 34.0 pg   MCHC 34.1 30.0 - 36.0 g/dL   RDW 86.5 78.4 - 69.6 %   Platelets 163 150 - 400 K/uL   nRBC 0.0 0.0 - 0.2 %   Neutrophils Relative % 39 %   Neutro Abs 1.7 1.7 - 7.7 K/uL   Lymphocytes Relative 47 %  Lymphs Abs 2.0 0.7 - 4.0 K/uL   Monocytes Relative 12 %   Monocytes Absolute 0.5 0.1 - 1.0 K/uL   Eosinophils Relative 1 %   Eosinophils Absolute 0.0 0.0 - 0.5 K/uL   Basophils Relative 1 %   Basophils Absolute 0.0 0.0 - 0.1 K/uL   Immature Granulocytes 0 %   Abs Immature Granulocytes 0.01 0.00 - 0.07 K/uL      Assessment & Plan:   Problem List Items Addressed This Visit       Other   Tobacco use    Still smoking 2 ppd. Encourage complete tobacco cessation.       Schizoaffective disorder, bipolar type (HCC)    Chronic, stable. She is not currently taking any medication for this and does not want to. She states that she does have some anxiety, but this is chronic and not affecting her everyday life. She denies SI/HI. Follow-up with worsening symptoms or concerns.       Anterior neck pain    Chronic, ongoing. She is still having throbbing neck pain, especially when turning her head to the left. She would like imaging of this. Will check MRI of her neck for further evaluation.       Relevant Orders   MR NECK SOFT TISSUE ONLY W WO CONTRAST   TSH   Mixed hyperlipidemia    Chronic, stable. Check CMP, CBC, lipid panel today and treat based on results.       Relevant Orders   CBC with  Differential/Platelet   Lipid panel   Comprehensive metabolic panel   Other Visit Diagnoses     Encounter for general adult medical examination with abnormal findings    -  Primary   Health maintenance reviewed and updated. Recommend regular exercise and healthy eating. See below for neck pain.   Sinus pain       Start claritin or zyrtec daily. get rid of the black mold ASAP. Can use warm/cool compresses.   Papule       Multiple papules to inner knees bilaterally. Start mupirocin cream twice a day to affected areas. F/U if not improving.        Follow up plan: Return if symptoms worsen or fail to improve, for after your MRI.   LABORATORY TESTING:  - Pap smear: up to date  IMMUNIZATIONS:   - Tdap: Tetanus vaccination status reviewed: Declined. - Influenza:  declined - Pneumovax: Not applicable - Prevnar: Not applicable - HPV: Not applicable - Shingrix vaccine: Not applicable  SCREENING: -Mammogram:  will schedule with GYN   - Colonoscopy: Ordered today  - Bone Density: Not applicable   PATIENT COUNSELING:   Advised to take 1 mg of folate supplement per day if capable of pregnancy.   Sexuality: Discussed sexually transmitted diseases, partner selection, use of condoms, avoidance of unintended pregnancy  and contraceptive alternatives.   Advised to avoid cigarette smoking.  I discussed with the patient that most people either abstain from alcohol or drink within safe limits (<=14/week and <=4 drinks/occasion for males, <=7/weeks and <= 3 drinks/occasion for females) and that the risk for alcohol disorders and other health effects rises proportionally with the number of drinks per week and how often a drinker exceeds daily limits.  Discussed cessation/primary prevention of drug use and availability of treatment for abuse.   Diet: Encouraged to adjust caloric intake to maintain  or achieve ideal body weight, to reduce intake of dietary saturated fat and total fat, to limit  sodium intake by avoiding high sodium foods and not adding table salt, and to maintain adequate dietary potassium and calcium preferably from fresh fruits, vegetables, and low-fat dairy products.    stressed the importance of regular exercise  Injury prevention: Discussed safety belts, safety helmets, smoke detector, smoking near bedding or upholstery.   Dental health: Discussed importance of regular tooth brushing, flossing, and dental visits.    NEXT PREVENTATIVE PHYSICAL DUE IN 1 YEAR. Return if symptoms worsen or fail to improve, for after your MRI.  Zelig Gacek A Carlethia Mesquita

## 2023-07-29 NOTE — Assessment & Plan Note (Signed)
Chronic, stable. She is not currently taking any medication for this and does not want to. She states that she does have some anxiety, but this is chronic and not affecting her everyday life. She denies SI/HI. Follow-up with worsening symptoms or concerns.

## 2023-07-29 NOTE — Assessment & Plan Note (Signed)
Still smoking 2 ppd. Encourage complete tobacco cessation.

## 2023-07-31 ENCOUNTER — Encounter: Payer: Self-pay | Admitting: Nurse Practitioner

## 2023-08-05 ENCOUNTER — Telehealth: Payer: Self-pay | Admitting: Nurse Practitioner

## 2023-08-05 NOTE — Telephone Encounter (Signed)
Please give the patient a call . Pt misplace the name/phone # you gave her for neuro dr and the ENT dr office

## 2023-08-06 ENCOUNTER — Telehealth: Payer: Self-pay | Admitting: Nurse Practitioner

## 2023-08-06 NOTE — Telephone Encounter (Signed)
I gave the pt the ENT address and # but pt said she still need a referral for neuro dr. Please call her again

## 2023-08-06 NOTE — Telephone Encounter (Signed)
I tried to call patient and mailbox full and unable to leave a message. I will send patient a Mychart message.

## 2023-09-02 LAB — HM MAMMOGRAPHY

## 2023-09-21 ENCOUNTER — Ambulatory Visit
Admission: RE | Admit: 2023-09-21 | Discharge: 2023-09-21 | Disposition: A | Discharge status: Home / Self Care | Payer: Managed Care, Other (non HMO) | Source: Ambulatory Visit | Attending: Nurse Practitioner | Admitting: Nurse Practitioner

## 2023-09-21 DIAGNOSIS — M542 Cervicalgia: Secondary | ICD-10-CM

## 2023-09-21 MED ORDER — GADOPICLENOL 0.5 MMOL/ML IV SOLN
8.5000 mL | Freq: Once | INTRAVENOUS | Status: AC | PRN
  Administered 2023-09-21: 8.5 mL via INTRAVENOUS

## 2023-12-04 ENCOUNTER — Ambulatory Visit: Payer: Managed Care, Other (non HMO) | Admitting: Family Medicine

## 2023-12-04 ENCOUNTER — Ambulatory Visit: Payer: Commercial Managed Care - HMO | Admitting: Family Medicine

## 2023-12-04 ENCOUNTER — Encounter: Payer: Self-pay | Admitting: Family Medicine

## 2023-12-04 VITALS — BP 134/82 | HR 77 | Temp 98.2°F | Ht 63.0 in | Wt 182.8 lb

## 2023-12-04 DIAGNOSIS — M79675 Pain in left toe(s): Secondary | ICD-10-CM

## 2023-12-04 DIAGNOSIS — L84 Corns and callosities: Secondary | ICD-10-CM

## 2023-12-04 DIAGNOSIS — S92422S Displaced fracture of distal phalanx of left great toe, sequela: Secondary | ICD-10-CM

## 2023-12-04 MED ORDER — NAPROXEN 500 MG PO TABS: 500.0000 mg | ORAL_TABLET | Freq: Two times a day (BID) | ORAL | 0 refills | Status: DC

## 2023-12-04 NOTE — Progress Notes (Signed)
 [facility] [address] [name] [address] Dept: [phone] Dept Fax: [phone]  Office Visit  Subjective:    Patient ID: Anita Ball, female    DOB: 08-Feb-1976, 48 y.o..   MRN: [id number]  Chief Complaint  Patient presents with   Toe Pain    C/o having LT big toe pain x 1 month.      History of Present Illness:  Patient is in today for evaluation of her left great toe. [name] notes that she injured her left great toe in an accident years ago. She states the toe was nearly torn off. She underwent surgery at [facility]. This included fusing the left 1st IP joint. She admits that she has some callus that had developed chronically on the plantar surface of this toe. About a month ago, she accidentally kicked a bowl. Her toe has been tender and burning since that time. She did not see any bruising or swelling occur. She finds the pain occurs when she puts pressure ont he toe in certain positions.  Past Medical History: Patient Active Problem List   Diagnosis Date Noted   Mixed hyperlipidemia 07/29/2023   Anterior neck pain 12/06/2022   Snoring 10/15/2022   Jaw pain 10/15/2022   Neck pain 07/23/2022   Trigger thumb, right thumb 06/25/2022   Arthritis of carpometacarpal [facility]) joint of right thumb 06/25/2022   Mouth pain 06/25/2022   Chronic pain of right thumb 06/06/2022   Anxiety 06/06/2022   IFG (impaired fasting glucose) 06/06/2022   Schizoaffective disorder, bipolar type (HCC) 09/27/2016   Tobacco use 07/19/2014   Systolic murmur 07/19/2014   Past Surgical History:  Procedure Laterality Date   FRACTURE SURGERY     Family History  Problem Relation Age of Onset   Depression Mother    Diabetes Father    Cancer Father        prostate & melanoma   Alcoholism Father    Diabetes Maternal Grandfather    Outpatient Medications Prior to Visit  Medication Sig Dispense Refill   busPIRone  (BUSPAR )  7.5 MG tablet Take 7.5 mg by mouth 2 (two) times daily.     mupirocin  cream (BACTROBAN ) 2 % Apply 1 Application topically 2 (two) times daily. (Patient not taking: Reported on 12/04/2023) 15 g 0   ibuprofen  (ADVIL ) 800 MG tablet Take 1 tablet (800 mg total) by mouth every 8 (eight) hours as needed. (Patient not taking: Reported on 07/29/2023) 30 tablet 0   No facility-administered medications prior to visit.   No Known Allergies   Objective:   Today's Vitals   12/04/23 1022  BP: 134/82  Pulse: 77  Temp: 98.2 F (36.8 C)  TempSrc: Temporal  SpO2: 99%  Weight: 182 lb 12.8 oz (82.9 kg)  Height: 5' 3 (1.6 m)   Body mass index is 32.38 kg/m.   General: Well developed, well nourished. No acute distress. Extremities: The patient has no ability to flex the IP joint of the left 1 st toe. There is no swelling or bruising noted. There is   a 4-5 mm thick callus on the plantar aspect of the toe.  Psych: Alert and oriented. Normal mood and affect.  Health Maintenance Due  Topic Date Due   Pneumococcal Vaccine 77-25 Years old (1 of 2 - PCV) Never done   Fecal DNA (Cologuard)  Never done     Assessment & Plan:  1. Great toe pain, left (Primary) 2. Callus of toe  3. Closed displaced fracture of distal phalanx of left great toe, sequela  I suspect the injury has stirred up some post-traumatic arthritis int he left great toe. I recommend she do hot soaks with Epsom salts daily and take a 7-day course of naproxen . I will also refer her to podiatry for paring down the callus and potential inserts for her shoes to help prevent recurrence of this.  - naproxen  (NAPROSYN ) 500 MG tablet; Take 1 tablet (500 mg total) by mouth 2 (two) times daily with a meal.  Dispense: 14 tablet; Refill: 0 - Ambulatory referral to Podiatry   Return if symptoms worsen or fail to improve.   [name], MD

## 2023-12-11 ENCOUNTER — Ambulatory Visit (INDEPENDENT_AMBULATORY_CARE_PROVIDER_SITE_OTHER): Payer: Commercial Managed Care - HMO

## 2023-12-11 ENCOUNTER — Ambulatory Visit: Payer: Self-pay | Admitting: Podiatry

## 2023-12-11 ENCOUNTER — Encounter: Payer: Self-pay | Admitting: Podiatry

## 2023-12-11 DIAGNOSIS — M79672 Pain in left foot: Secondary | ICD-10-CM

## 2023-12-11 DIAGNOSIS — M19072 Primary osteoarthritis, left ankle and foot: Secondary | ICD-10-CM

## 2023-12-11 MED ORDER — TERBINAFINE HCL 250 MG PO TABS: 250.0000 mg | ORAL_TABLET | Freq: Every day | ORAL | 0 refills | Status: DC
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
## 2024-01-15 ENCOUNTER — Encounter: Payer: Self-pay | Admitting: Podiatry

## 2024-01-15 ENCOUNTER — Ambulatory Visit: Payer: Commercial Managed Care - HMO | Admitting: Podiatry

## 2024-01-15 VITALS — Ht 63.0 in | Wt 182.8 lb

## 2024-01-15 DIAGNOSIS — S92912A Unspecified fracture of left toe(s), initial encounter for closed fracture: Secondary | ICD-10-CM
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
## 2024-02-03 ENCOUNTER — Ambulatory Visit: Admitting: Podiatry

## 2024-02-24 ENCOUNTER — Encounter: Payer: Self-pay | Admitting: Podiatry

## 2024-02-24 ENCOUNTER — Ambulatory Visit (INDEPENDENT_AMBULATORY_CARE_PROVIDER_SITE_OTHER)

## 2024-02-24 ENCOUNTER — Ambulatory Visit: Admitting: Podiatry

## 2024-02-24 DIAGNOSIS — S92912A Unspecified fracture of left toe(s), initial encounter for closed fracture: Secondary | ICD-10-CM

## 2024-02-24 DIAGNOSIS — M898X7 Other specified disorders of bone, ankle and foot: Secondary | ICD-10-CM

## 2024-02-24 DIAGNOSIS — M2012 Hallux valgus (acquired), left foot: Secondary | ICD-10-CM | POA: Diagnosis not present
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
## 2024-02-25 ENCOUNTER — Telehealth: Payer: Self-pay | Admitting: Podiatry

## 2024-02-25 NOTE — Telephone Encounter (Signed)
 Received surgical consent. Left message for pt to call to schedule surgery.

## 2024-02-26 ENCOUNTER — Telehealth: Payer: Self-pay

## 2024-02-26 DIAGNOSIS — Z01818 Encounter for other preprocedural examination: Secondary | ICD-10-CM

## 2024-02-26 NOTE — Telephone Encounter (Signed)
 Copied from CRM [id number]. Topic: Referral - Request for Referral >> Feb 26, 2024 11:46 AM Howard Macho wrote: Did the patient discuss referral with their provider in the last year? No (If No - schedule appointment) (If Yes - send message)  Appointment offered? No  Type of order/referral and detailed reason for visit: cardiologist (patient is having surgery on her foot and her foot doctor want to make sure that her heart is fine before she goes to have the surgery because she is having neck pain)  Preference of office, provider, location: [place]  If referral order, have you been seen by this specialty before? Yes (If Yes, this issue or another issue? When? Where?  Can we respond through MyChart? Yes

## 2024-02-27 ENCOUNTER — Ambulatory Visit

## 2024-02-27 VITALS — BP 130/90 | HR 75 | Ht 63.0 in | Wt 182.0 lb

## 2024-02-27 DIAGNOSIS — R030 Elevated blood-pressure reading, without diagnosis of hypertension: Secondary | ICD-10-CM | POA: Insufficient documentation

## 2024-02-27 DIAGNOSIS — Z01810 Encounter for preprocedural cardiovascular examination: Secondary | ICD-10-CM | POA: Insufficient documentation

## 2024-02-27 DIAGNOSIS — R079 Chest pain, unspecified: Secondary | ICD-10-CM | POA: Diagnosis not present

## 2024-02-27 DIAGNOSIS — I370 Nonrheumatic pulmonary valve stenosis: Secondary | ICD-10-CM | POA: Diagnosis not present

## 2024-02-27 DIAGNOSIS — Z720 Tobacco use: Secondary | ICD-10-CM

## 2024-02-27 NOTE — Patient Instructions (Signed)
 Medication Instructions:  Your physician recommends that you continue on your current medications as directed. Please refer to the Current Medication list given to you today.  *If you need a refill on your cardiac medications before your next appointment, please call your pharmacy*   Lab Work: None Ordered If you have labs (blood work) drawn today and your tests are completely normal, you will receive your results only by: MyChart Message (if you have MyChart) OR A paper copy in the mail If you have any lab test that is abnormal or we need to change your treatment, we will call you to review the results.   Testing/Procedures: We will order CT coronary calcium  score. It will cost $99.00 and iis due at time of scan.  Please call to schedule.     [facility] [address] [place], [place] [address] [phone]  Or  [facility] [address] [place], [place] [address] [phone]   Echocardiogram An echocardiogram is a test that uses sound waves (ultrasound) to produce images of the heart. Images from an echocardiogram can provide important information about: Heart size and shape. The size and thickness and movement of your heart's walls. Heart muscle function and strength. Heart valve function or if you have stenosis. Stenosis is when the heart valves are too narrow. If blood is flowing backward through the heart valves (regurgitation). A tumor or infectious growth around the heart valves. Areas of heart muscle that are not working well because of poor blood flow or injury from a heart attack. Aneurysm detection. An aneurysm is a weak or damaged part of an artery wall. The wall bulges out from the normal force of blood pumping through the body. Tell a health care provider about: Any allergies you have. All medicines you are taking, including vitamins, herbs, eye drops, creams, and over-the-counter medicines. Any blood disorders you have. Any surgeries  you have had. Any medical conditions you have. Whether you are pregnant or may be pregnant. What are the risks? Generally, this is a safe test. However, problems may occur, including an allergic reaction to dye (contrast) that may be used during the test. What happens before the test? No specific preparation is needed. You may eat and drink normally. What happens during the test?  You will take off your clothes from the waist up and put on a hospital gown. Electrodes or electrocardiogram (ECG)patches may be placed on your chest. The electrodes or patches are then connected to a device that monitors your heart rate and rhythm. You will lie down on a table for an ultrasound exam. A gel will be applied to your chest to help sound waves pass through your skin. A handheld device, called a transducer, will be pressed against your chest and moved over your heart. The transducer produces sound waves that travel to your heart and bounce back (or "echo" back) to the transducer. These sound waves will be captured in real-time and changed into images of your heart that can be viewed on a video monitor. The images will be recorded on a computer and reviewed by your health care provider. You may be asked to change positions or hold your breath for a short time. This makes it easier to get different views or better views of your heart. In some cases, you may receive contrast through an IV in one of your veins. This can improve the quality of the pictures from your heart. The procedure may vary among health care providers  and hospitals. What can I expect after the test? You may return to your normal, everyday life, including diet, activities, and medicines, unless your health care provider tells you not to do that. Follow these instructions at home: It is up to you to get the results of your test. Ask your health care provider, or the department that is doing the test, when your results will be ready. Keep all  follow-up visits. This is important. Summary An echocardiogram is a test that uses sound waves (ultrasound) to produce images of the heart. Images from an echocardiogram can provide important information about the size and shape of your heart, heart muscle function, heart valve function, and other possible heart problems. You do not need to do anything to prepare before this test. You may eat and drink normally. After the echocardiogram is completed, you may return to your normal, everyday life, unless your health care provider tells you not to do that. This information is not intended to replace advice given to you by your health care provider. Make sure you discuss any questions you have with your health care provider. Document Revised: 06/28/2021 Document Reviewed: 06/07/2020 Elsevier Patient Education  2023 Elsevier Inc.      Treadmill Stress Test Instructions:    1. You may take all of your medications   2. No food, drink or tobacco products 2 hours prior to your test.  3. Dress prepared to exercise. Best to wear 2 piece outfit and tennis shoes. Shoes must be closed toe.  4. Please bring all current prescription medications.  Follow-Up: At [facility], you and your health needs are our priority.  As part of our continuing mission to provide you with exceptional heart care, we have created designated Provider Care Teams.  These Care Teams include your primary Cardiologist (physician) and Advanced Practice Providers (APPs -  Physician Assistants and Nurse Practitioners) who all work together to provide you with the care you need, when you need it.  We recommend signing up for the patient portal called "MyChart".  Sign up information is provided on this After Visit Summary.  MyChart is used to connect with patients for Virtual Visits (Telemedicine).  Patients are able to view lab/test results, encounter notes, upcoming appointments, etc.  Non-urgent messages can be sent to your  provider as well.   To learn more about what you can do with MyChart, go to [email or web address].    Your next appointment:   Follow up based on test results

## 2024-02-27 NOTE — Progress Notes (Signed)
 Cardiology Consultation:    Date:  02/27/2024   ID:  Anita Ball, DOB 12/10/1975, MRN [id number]  PCP:  Anita Benjamin, NP  Cardiologist:  Anita Kells, MD   Referring MD: Anita Benjamin, NP   Chief Complaint  Patient presents with   Pre-op Exam     ASSESSMENT AND PLAN:   [name] 48 year old woman does not have any significant cardiac history but does have risk factors in the form of smoking, elevated blood pressures today at the office visit without prior diagnosis of hypertension.  Relatively good baseline functional status but does report symptoms of chest pain under the left breast and shortness of breath with exertion.  Has chronic noncardiac and musculoskeletal sounding pain on the left anterior aspect of the neck.  And prior echocardiogram in 2015 noted increased gradients across the pulmonary valve with mean gradient of 14 mmHg.  Problem List Items Addressed This Visit     Tobacco use   Advised about the harmful effects of smoking. She is aware and is willing to consider cutting down.  Further options with nicotine  patches to aid with quitting reviewed.      Chest pain of uncertain etiology   Chest pain is atypical and at times exertional. She does have cardiovascular risk factors. Also proceeding for elective surgery on the foot under general anesthesia.  Will schedule her for Ball treadmill EKG stress test to rule out any significant coronary artery disease concerns with ischemia.      Relevant Orders   CT CARDIAC SCORING   Exercise Tolerance Test   Preoperative cardiovascular examination - Primary   Low risk noncardiac surgery on the left foot anticipated. Functional status difficult to assess but mentions she is able to walk without any major limitations but unable to assess if she is able to meet more than 4 METS activity.  Since the surgery is currently under general anesthesia as she reported, we will further stratify.  Will schedule her  for treadmill EKG stress to assess her metabolic equivalents, if needing more than 4 METS, should be okay to proceed with surgery as being planned.        Relevant Orders   EKG 12-Lead (Completed)   Exercise Tolerance Test   Pulmonary valve stenosis   Prior echocardiogram was from 2015 with peak gradient 27 mmHg and mean 14 mmHg.  Could not appreciate any significant murmur on physical exam today.  Will repeat transthoracic echocardiogram for interval assessment.       Relevant Orders   ECHOCARDIOGRAM COMPLETE   Elevated blood pressure reading in office without diagnosis of hypertension   Advised her to keep Ball log of blood pressures at home for the next 2 weeks. If blood pressures are consistently above 130/80 mmHg will recommend antihypertensive treatment       Return to clinic based on test results.  History of Present Illness:    Anita Ball is Ball 48 y.o. female who is being seen today for the evaluation of preop cardiovascular risk assessment prior to foot surgery being planned for bunionectomy with distal first metatarsal osteotomy of the left foot and exostectomy plantar aspect of the left IPJ and also reports symptoms of throbbing sensation in the left side of her neck at the request of McElwee, [name], NP.  Remote history of cardiology visit in September 2015 with Dr. Nicholette Ball for atypical chest pain and heart murmur and workup at the time with echocardiogram October 2015 noted normal  biventricular function with mild TR and mildly increased gradients across the pulmonary valve gradient of peak 27 mmHg and mean 14 mmHg.  Pleasant woman here for the visit by herself.  Lives at home by herself.  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  Reports remote history of foot injury 1990s requiring surgery and now dealing with chronic left foot issues and anticipating surgery, increased gradients across pulmonary valve on prior echocardiogram October 2015 as noted above, anxiety,  smoking cigarettes, occasional marijuana use and occasional alcohol consumption. Mentions no significant prior cardiac history of CAD, CHF, MI.  Had been major issue she reports has been pain on the left aspect of her neck which has been going on for the past year which is present more or less on Ball daily basis with certain movements of the head and neck increasing the pain.  At times this occurs randomly.  She has had workup including cardiac MRI in the past without any major abnormality.  Does not appear to be exertional. She does not routinely check blood pressures at home. Blood pressures here in the office today elevated mildly 130/90 mmHg.  Describes Ball sensation of chest discomfort under her left breast that occurs at times with exertion.  Does report shortness of breath which she attributes to her long history of smoking.  Mentions this has been somewhat limiting at times.  She is able to do her activities at home and at work without major limitation.  Walking can cause symptoms at times. No orthopnea, no paroxysmal nocturnal dyspnea. No symptoms of lightheadedness, dizziness, syncopal episodes or palpitations.  Her left foot discomfort at times limits her from walking fast but has been using the boot for the past 6 weeks which helps relieve pain of the pressure points.  She really uses sneakers and able to walk.  Smokes up to 2 packs Ball day. Drinks alcohol occasionally, once Ball month couple glasses of wine with her meal. Occasional marijuana smoking.  EKG in the clinic today shows sinus rhythm heart rate 75/min, PR interval 136 ms, QRS duration 80 ms, QTc 415 ms, normal.  For left anterior neck pain in November 2024 she had cardiac MRI done that noted no significant abnormalities  The 10-year ASCVD risk score (Arnett DK, et al., 2019) is: 3%   Values used to calculate the score:     Age: 89 years     Sex: Female     Is Non-Hispanic African American: No     Diabetic: No     Tobacco  smoker: Yes     Systolic Blood Pressure: 130 mmHg     Is BP treated: No     HDL Cholesterol: 55.2 mg/dL     Total Cholesterol: 200 mg/dL  Lipid Panel     Component Value Date/Time   CHOL 200 07/29/2023 1036   TRIG 173.0 (H) 07/29/2023 1036   HDL 55.20 07/29/2023 1036   CHOLHDL 4 07/29/2023 1036   VLDL 34.6 07/29/2023 1036   LDLCALC 111 (H) 07/29/2023 1036     Past Medical History:  Diagnosis Date   Anxiety    Depression    Epilepsy (HCC)    "as Ball child"   Heart murmur    Seizures (HCC)     Past Surgical History:  Procedure Laterality Date   FRACTURE SURGERY      Current Medications: Current Meds  Medication Sig   busPIRone (BUSPAR) 7.5 MG tablet Take 7.5 mg by mouth daily as needed (anxiety).  Allergies:   Patient has no known allergies.   Social History   Socioeconomic History   Marital status: Widowed    Spouse name: Not on file   Number of children: Not on file   Years of education: Not on file   Highest education level: Not on file  Occupational History   Not on file  Tobacco Use   Smoking status: Every Day    Current packs/day: 2.00    Average packs/day: 2.0 packs/day for 33.3 years (66.7 ttl pk-yrs)    Types: Cigarettes    Start date: 5   Smokeless tobacco: Never  Vaping Use   Vaping status: Every Day  Substance and Sexual Activity   Alcohol use: Yes    Comment: occasionally   Drug use: Yes    Types: Marijuana   Sexual activity: Yes  Other Topics Concern   Not on file  Social History Narrative   Not on file   Social Drivers of Health   Financial Resource Strain: Not on file  Food Insecurity: Not on file  Transportation Needs: Not on file  Physical Activity: Not on file  Stress: Not on file  Social Connections: Not on file     Family History: The patient's family history includes Alcoholism in her father; Cancer in her father; Depression in her mother; Diabetes in her father and maternal grandfather. ROS:   Please see the  history of present illness.    All 14 point review of systems negative except as described per history of present illness.  EKGs/Labs/Other Studies Reviewed:    The following studies were reviewed today:   EKG:  EKG Interpretation Date/Time:  Thursday Feb 27 2024 08:55:55 EDT Ventricular Rate:  75 PR Interval:  136 QRS Duration:  80 QT Interval:  372 QTC Calculation: 415 R Axis:   55  Text Interpretation: Normal sinus rhythm Septal infarct , age undetermined When compared with ECG of 12-May-2022 16:15, No significant change was found Confirmed by Bertha Broad reddy [phone]) on 02/27/2024 9:01:18 AM    Recent Labs: 07/29/2023: ALT 24; BUN 9; Creatinine, Ser 0.79; Hemoglobin 16.0; Platelets 216.0; Potassium 4.2; Sodium 140; TSH 0.73  Recent Lipid Panel    Component Value Date/Time   CHOL 200 07/29/2023 1036   TRIG 173.0 (H) 07/29/2023 1036   HDL 55.20 07/29/2023 1036   CHOLHDL 4 07/29/2023 1036   VLDL 34.6 07/29/2023 1036   LDLCALC 111 (H) 07/29/2023 1036    Physical Exam:    VS:  BP (!) 130/90   Pulse 75   Ht 5\' 3"  (1.6 m)   Wt 182 lb (82.6 kg)   SpO2 98%   BMI 32.24 kg/m     Wt Readings from Last 3 Encounters:  02/27/24 182 lb (82.6 kg)  01/15/24 182 lb 12.8 oz (82.9 kg)  12/04/23 182 lb 12.8 oz (82.9 kg)     GENERAL:  Well nourished, well developed in no acute distress NECK: No JVD; No carotid bruits CARDIAC: RRR, S1 and S2 present, no murmurs, no rubs, no gallops CHEST:  Clear to auscultation without rales, wheezing or rhonchi  Extremities: No pitting pedal edema. Pulses bilaterally symmetric with radial 2+ and dorsalis pedis 2+ NEUROLOGIC:  Alert and oriented x 3  Medication Adjustments/Labs and Tests Ordered: Current medicines are reviewed at length with the patient today.  Concerns regarding medicines are outlined above.  Orders Placed This Encounter  Procedures   CT CARDIAC SCORING   Exercise Tolerance Test   EKG 12-Lead  ECHOCARDIOGRAM COMPLETE    No orders of the defined types were placed in this encounter.   Signed, Lura Sallies, MD, MPH, [facility]. 02/27/2024 9:46 AM    [facility]

## 2024-02-27 NOTE — Assessment & Plan Note (Addendum)
 Chest pain is atypical and at times exertional. She does have cardiovascular risk factors. Also proceeding for elective surgery on the foot under general anesthesia.  Will schedule her for a treadmill EKG stress test to rule out any significant coronary artery disease concerns with ischemia.

## 2024-02-27 NOTE — Assessment & Plan Note (Signed)
 Prior echocardiogram was from 2015 with peak gradient 27 mmHg and mean 14 mmHg.  Could not appreciate any significant murmur on physical exam today.  Will repeat transthoracic echocardiogram for interval assessment.

## 2024-02-27 NOTE — Assessment & Plan Note (Signed)
 Advised her to keep a log of blood pressures at home for the next 2 weeks. If blood pressures are consistently above 130/80 mmHg will recommend antihypertensive treatment

## 2024-02-27 NOTE — Assessment & Plan Note (Signed)
 Advised about the harmful effects of smoking. She is aware and is willing to consider cutting down.  Further options with nicotine  patches to aid with quitting reviewed.

## 2024-02-27 NOTE — Assessment & Plan Note (Signed)
 Low risk noncardiac surgery on the left foot anticipated. Functional status difficult to assess but mentions she is able to walk without any major limitations but unable to assess if she is able to meet more than 4 METS activity.  Since the surgery is currently under general anesthesia as she reported, we will further stratify.  Will schedule her for treadmill EKG stress to assess her metabolic equivalents, if needing more than 4 METS, should be okay to proceed with surgery as being planned.

## 2024-03-03 ENCOUNTER — Telehealth (HOSPITAL_BASED_OUTPATIENT_CLINIC_OR_DEPARTMENT_OTHER): Payer: Self-pay

## 2024-03-10 ENCOUNTER — Encounter: Payer: Self-pay | Admitting: Podiatry

## 2024-03-12 ENCOUNTER — Encounter (HOSPITAL_COMMUNITY): Payer: Self-pay

## 2024-03-12 MED ORDER — AMLODIPINE BESYLATE 2.5 MG PO TABS: 2.5000 mg | ORAL_TABLET | Freq: Every day | ORAL | 3 refills | Status: DC

## 2024-03-26 ENCOUNTER — Telehealth (HOSPITAL_COMMUNITY): Payer: Self-pay | Admitting: *Deleted

## 2024-03-26 NOTE — Telephone Encounter (Signed)
 Reminder call given for upcoming ETT on 04/03/24 at 11:30.

## 2024-03-27 ENCOUNTER — Encounter (HOSPITAL_COMMUNITY)

## 2024-03-27 ENCOUNTER — Ambulatory Visit: Payer: Self-pay

## 2024-03-27 ENCOUNTER — Ambulatory Visit (HOSPITAL_COMMUNITY)
Admission: RE | Admit: 2024-03-27 | Discharge: 2024-03-27 | Disposition: A | Discharge status: Home / Self Care | Source: Ambulatory Visit | Attending: Cardiology | Admitting: Cardiology

## 2024-03-27 DIAGNOSIS — I370 Nonrheumatic pulmonary valve stenosis: Secondary | ICD-10-CM

## 2024-03-27 DIAGNOSIS — I351 Nonrheumatic aortic (valve) insufficiency: Secondary | ICD-10-CM | POA: Insufficient documentation

## 2024-03-27 LAB — ECHOCARDIOGRAM COMPLETE
Area-P 1/2: 3.45 cm2
P 1/2 time: 586 ms
S' Lateral: 2.9 cm

## 2024-03-31 ENCOUNTER — Telehealth: Payer: Self-pay | Admitting: Podiatry

## 2024-03-31 NOTE — Telephone Encounter (Signed)
 Pt called and is scheduled for surgery on 04/23/24

## 2024-04-02 ENCOUNTER — Encounter: Payer: Self-pay | Admitting: Podiatry

## 2024-04-02 NOTE — Addendum Note (Signed)
 Addended by: Bertha Broad REDDY on: 04/02/2024 09:44 AM   Modules accepted: Orders

## 2024-04-03 ENCOUNTER — Ambulatory Visit (HOSPITAL_COMMUNITY)
Admission: RE | Admit: 2024-04-03 | Discharge: 2024-04-03 | Disposition: A | Discharge status: Home / Self Care | Source: Ambulatory Visit | Attending: Internal Medicine | Admitting: Internal Medicine

## 2024-04-03 ENCOUNTER — Telehealth: Payer: Self-pay

## 2024-04-03 DIAGNOSIS — R079 Chest pain, unspecified: Secondary | ICD-10-CM | POA: Diagnosis present

## 2024-04-03 DIAGNOSIS — Z01810 Encounter for preprocedural cardiovascular examination: Secondary | ICD-10-CM | POA: Insufficient documentation

## 2024-04-03 LAB — EXERCISE TOLERANCE TEST
Angina Index: 0
Base ST Depression (mm): 0 mm
Duke Treadmill Score: 8
Estimated workload: 10.1
Exercise duration (min): 8 min
Exercise duration (sec): 1 s
MPHR: 174 {beats}/min
Peak HR: 160 {beats}/min
Percent HR: 91 %
RPE: 18
Rest HR: 63 {beats}/min
ST Depression (mm): 0 mm

## 2024-04-03 NOTE — Telephone Encounter (Signed)
 I called and spoke with patient and advised her that she will need an appointment for surgery clearance form to be completed. Appointment made for 04/06/2024 at 3pm.

## 2024-04-06 ENCOUNTER — Encounter: Payer: Self-pay | Admitting: Nurse Practitioner

## 2024-04-06 ENCOUNTER — Ambulatory Visit (INDEPENDENT_AMBULATORY_CARE_PROVIDER_SITE_OTHER): Admitting: Nurse Practitioner

## 2024-04-06 VITALS — BP 124/76 | HR 80 | Temp 98.0°F | Ht 63.0 in | Wt 182.2 lb

## 2024-04-06 DIAGNOSIS — E782 Mixed hyperlipidemia: Secondary | ICD-10-CM

## 2024-04-06 DIAGNOSIS — Z01818 Encounter for other preprocedural examination: Secondary | ICD-10-CM | POA: Diagnosis not present

## 2024-04-06 DIAGNOSIS — M2550 Pain in unspecified joint: Secondary | ICD-10-CM

## 2024-04-06 DIAGNOSIS — M542 Cervicalgia: Secondary | ICD-10-CM

## 2024-04-06 DIAGNOSIS — Z720 Tobacco use: Secondary | ICD-10-CM

## 2024-04-06 DIAGNOSIS — M2012 Hallux valgus (acquired), left foot: Secondary | ICD-10-CM

## 2024-04-06 DIAGNOSIS — F419 Anxiety disorder, unspecified: Secondary | ICD-10-CM

## 2024-04-06 DIAGNOSIS — I351 Nonrheumatic aortic (valve) insufficiency: Secondary | ICD-10-CM

## 2024-04-06 MED ORDER — BUSPIRONE HCL 7.5 MG PO TABS: 7.5000 mg | ORAL_TABLET | Freq: Two times a day (BID) | ORAL | 2 refills | Status: AC

## 2024-04-06 NOTE — Patient Instructions (Addendum)
 It was great to see you!  We are checking your labs today and will let you know the results via mychart/phone.   I hope your surgery goes well!   You can try to use voltaren gel on your hands/wrists/elbows   I have placed a referral to ENT, they will call to schedule   Let's follow-up in 3 months, sooner if you have concerns.  If a referral was placed today, you will be contacted for an appointment. Please note that routine referrals can sometimes take up to 3-4 weeks to process. Please call our office if you haven't heard anything after this time frame.  Take care,  Rheba Cedar, NP

## 2024-04-06 NOTE — Assessment & Plan Note (Signed)
 Chronic, ongoing. She denies SI/HI. Continue buspar 7.5mg  BID. Follow-up in 3 months.

## 2024-04-06 NOTE — Assessment & Plan Note (Signed)
 Intermittent anterior neck pain may be due to muscular tension, blood pressure-related sensations. Cardiac causes have been ruled out. Refer to ENT for further evaluation.

## 2024-04-06 NOTE — Assessment & Plan Note (Signed)
 She smokes two packs per day and experiences respiratory symptoms. Encourage smoking cessation.

## 2024-04-06 NOTE — Assessment & Plan Note (Signed)
Chronic, stable.  Check CMP, CBC, lipid panel today and treat based on results.
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
## 2024-04-06 NOTE — Assessment & Plan Note (Signed)
 Swelling, pain, and stiffness in multiple joints suggest possible generalized arthritis vs rheumatoid arthritis. Recommend Voltaren gel for symptomatic relief and consider homemade salt cream for inflammation relief. Check ANA, RF today.

## 2024-04-06 NOTE — Progress Notes (Signed)
 Sent note with regards to her stress test results. She is okay to proceed with her elective surgery for the left foot as being scheduled.  please forward results to her PCP. Please schedule her for follow-up visit with me in 3 months to review calcium  score results. Thank you

## 2024-04-06 NOTE — Assessment & Plan Note (Signed)
 She is following with cardiology and had a normal stress test. Continue collaboration and recommendations from cardiology.

## 2024-04-07 ENCOUNTER — Telehealth: Payer: Self-pay | Admitting: Podiatry

## 2024-04-07 ENCOUNTER — Ambulatory Visit: Payer: Self-pay | Admitting: Nurse Practitioner

## 2024-04-07 LAB — CBC WITH DIFFERENTIAL/PLATELET
Absolute Lymphocytes: 2385 {cells}/uL (ref 850–3900)
Absolute Monocytes: 509 {cells}/uL (ref 200–950)
Basophils Absolute: 47 {cells}/uL (ref 0–200)
Basophils Relative: 0.7 %
Eosinophils Absolute: 281 {cells}/uL (ref 15–500)
Eosinophils Relative: 4.2 %
HCT: 44.2 % (ref 35.0–45.0)
Hemoglobin: 14.7 g/dL (ref 11.7–15.5)
MCH: 30.9 pg (ref 27.0–33.0)
MCHC: 33.3 g/dL (ref 32.0–36.0)
MCV: 92.9 fL (ref 80.0–100.0)
MPV: 11.9 fL (ref 7.5–12.5)
Monocytes Relative: 7.6 %
Neutro Abs: 3477 {cells}/uL (ref 1500–7800)
Neutrophils Relative %: 51.9 %
Platelets: 237 10*3/uL (ref 140–400)
RBC: 4.76 10*6/uL (ref 3.80–5.10)
RDW: 13.4 % (ref 11.0–15.0)
Total Lymphocyte: 35.6 %
WBC: 6.7 10*3/uL (ref 3.8–10.8)

## 2024-04-07 LAB — COMPREHENSIVE METABOLIC PANEL WITH GFR
AG Ratio: 1.8 (calc) (ref 1.0–2.5)
ALT: 22 U/L (ref 6–29)
AST: 17 U/L (ref 10–35)
Albumin: 4.4 g/dL (ref 3.6–5.1)
Alkaline phosphatase (APISO): 43 U/L (ref 31–125)
BUN: 11 mg/dL (ref 7–25)
CO2: 27 mmol/L (ref 20–32)
Calcium: 9.5 mg/dL (ref 8.6–10.2)
Chloride: 108 mmol/L (ref 98–110)
Creat: 0.86 mg/dL (ref 0.50–0.99)
Globulin: 2.4 g/dL (ref 1.9–3.7)
Glucose, Bld: 105 mg/dL — ABNORMAL HIGH (ref 65–99)
Potassium: 4.1 mmol/L (ref 3.5–5.3)
Sodium: 141 mmol/L (ref 135–146)
Total Bilirubin: 0.4 mg/dL (ref 0.2–1.2)
Total Protein: 6.8 g/dL (ref 6.1–8.1)
eGFR: 84 mL/min/{1.73_m2} (ref >=60)

## 2024-04-07 LAB — LIPID PANEL
Cholesterol: 194 mg/dL (ref <200)
HDL: 53 mg/dL (ref >=50)
LDL Cholesterol (Calc): 107 mg/dL — ABNORMAL HIGH
Non-HDL Cholesterol (Calc): 141 mg/dL — ABNORMAL HIGH (ref <130)
Total CHOL/HDL Ratio: 3.7 (calc) (ref <5.0)
Triglycerides: 217 mg/dL — ABNORMAL HIGH (ref <150)

## 2024-04-07 LAB — ANA W/REFLEX: Anti Nuclear Antibody (ANA): NEGATIVE

## 2024-04-07 LAB — RHEUMATOID FACTOR: Rheumatoid fact SerPl-aCnc: <10 [IU]/mL (ref <14)

## 2024-04-07 NOTE — Telephone Encounter (Signed)
 Received surgical clearance for pt.

## 2024-04-08 NOTE — Progress Notes (Signed)
 Patient rescheduled for 3 months out/kbl 04/08/24

## 2024-04-15 ENCOUNTER — Telehealth: Payer: Self-pay

## 2024-04-15 NOTE — Telephone Encounter (Signed)
 DOS 04/23/2024  Claudine Cullens - 16109 LT EXOSTECTOMY - 28108 LT  [employer]  PER AUTOMATED SYSTEM NO PRECERT IS REQUIRED FOR CPT [id number], CONF # [id number], CONF # [id number]

## 2024-04-15 NOTE — Telephone Encounter (Signed)
 Results message sent by Dr. Ronell Coe. Routed to PCP.

## 2024-04-15 NOTE — Telephone Encounter (Signed)
 Pt viewed Echo results on My Chart per Dr. Madireddy's note. Routed to PCP.

## 2024-04-22 ENCOUNTER — Telehealth: Payer: Self-pay | Admitting: Podiatry

## 2024-04-22 NOTE — Telephone Encounter (Signed)
 Montie called from surgery center and just spoke to pt. Pt stated she took ibuprofen  yesterday and she is wanting to make sure ok to proceed with surgery.

## 2024-04-22 NOTE — Telephone Encounter (Signed)
 Called pt to let her know it was ok to proceed with surgery per Dr Janit but her mailbox is full. I will send a my chart message.  I will notify surgery center as well.

## 2024-04-23 ENCOUNTER — Other Ambulatory Visit: Payer: Self-pay | Admitting: Podiatry

## 2024-04-23 DIAGNOSIS — M2012 Hallux valgus (acquired), left foot: Secondary | ICD-10-CM | POA: Diagnosis not present

## 2024-04-23 DIAGNOSIS — M25775 Osteophyte, left foot: Secondary | ICD-10-CM | POA: Diagnosis not present

## 2024-04-23 MED ORDER — IBUPROFEN 800 MG PO TABS: 800.0000 mg | ORAL_TABLET | Freq: Three times a day (TID) | ORAL | 1 refills | Status: AC

## 2024-04-23 MED ORDER — OXYCODONE-ACETAMINOPHEN 5-325 MG PO TABS: 1.0000 | ORAL_TABLET | ORAL | 0 refills | Status: DC | PRN

## 2024-04-23 NOTE — Progress Notes (Signed)
 PRN postop

## 2024-04-29 ENCOUNTER — Encounter: Payer: Self-pay | Admitting: Podiatry

## 2024-04-29 ENCOUNTER — Ambulatory Visit (INDEPENDENT_AMBULATORY_CARE_PROVIDER_SITE_OTHER): Admitting: Podiatry

## 2024-04-29 ENCOUNTER — Ambulatory Visit (INDEPENDENT_AMBULATORY_CARE_PROVIDER_SITE_OTHER)

## 2024-04-29 VITALS — Ht 63.0 in | Wt 182.2 lb

## 2024-04-29 DIAGNOSIS — M2012 Hallux valgus (acquired), left foot: Secondary | ICD-10-CM

## 2024-04-29 DIAGNOSIS — Z9889 Other specified postprocedural states: Secondary | ICD-10-CM

## 2024-04-29 MED ORDER — OXYCODONE-ACETAMINOPHEN 5-325 MG PO TABS: 1.0000 | ORAL_TABLET | Freq: Four times a day (QID) | ORAL | 0 refills | Status: DC | PRN

## 2024-04-29 NOTE — Addendum Note (Signed)
 Addended by: JANIT THRESA HERO on: 04/29/2024 11:10 AM   Modules accepted: Orders

## 2024-04-29 NOTE — Progress Notes (Signed)
   Chief Complaint  Patient presents with   Routine Post Op     POV # 1 DOS 04/23/24 -LT BUNIONECTOMY W/ OSTEOTOMY, LT GREAT TOE EXOXTECTOMY     Subjective:  Patient presents today status post bunionectomy with osteotomy left foot as well as exostectomy to the IPJ of the left great toe.  DOS: 04/23/2024.  Doing well.  WBAT CAM boot as instructed.  No new complaints  Past Medical History:  Diagnosis Date   Anxiety    Depression    Epilepsy (HCC)    as a child   Heart murmur    Seizures (HCC)     Past Surgical History:  Procedure Laterality Date   FRACTURE SURGERY      No Known Allergies  Objective/Physical Exam Neurovascular status intact.  Incision well coapted with sutures intact. No sign of infectious process noted. No dehiscence. No active bleeding noted.  Moderate edema noted to the surgical extremity.  Radiographic Exam LT foot 04/29/2024:  Orthopedic hardware and osteotomies sites appear to be stable with routine healing.  Good alignment of the first ray.  Eccrine osteotomy noted to the IPJ  Assessment: 1. s/p bunionectomy with distal osteotomy LT.  Exostectomy IPJ LT. DOS: 04/23/2024   Plan of Care:  -Patient was evaluated. X-rays reviewed - Dressings changed.  Leave clean dry and intact x 1 week -Continue minimal WBAT CAM boot -Continue RICE -Return to clinic 1 week suture removal   Thresa EMERSON Sar, DPM [facility]  Dr. Thresa EMERSON Sar, DPM    [address] [place], [place] [address]                Office [phone]  Fax [phone]

## 2024-05-05 ENCOUNTER — Ambulatory Visit

## 2024-05-06 ENCOUNTER — Ambulatory Visit (INDEPENDENT_AMBULATORY_CARE_PROVIDER_SITE_OTHER)

## 2024-05-06 ENCOUNTER — Ambulatory Visit (INDEPENDENT_AMBULATORY_CARE_PROVIDER_SITE_OTHER): Admitting: Podiatry

## 2024-05-06 ENCOUNTER — Encounter: Payer: Self-pay | Admitting: Podiatry

## 2024-05-06 VITALS — BP 136/88 | HR 81 | Temp 97.9°F

## 2024-05-06 DIAGNOSIS — M898X7 Other specified disorders of bone, ankle and foot: Secondary | ICD-10-CM

## 2024-05-06 DIAGNOSIS — M2012 Hallux valgus (acquired), left foot: Secondary | ICD-10-CM

## 2024-05-06 NOTE — Progress Notes (Signed)
  Subjective:  Patient ID: Anita Ball, female    DOB: 01-24-1976,  MRN: [id number]  Chief Complaint  Patient presents with   Routine Post Op    POV # 2 DOS 04/23/24 -LT BUNIONECTOMY W/ OSTEOTOMY, LT GREAT TOE EXOXTECTOMY It's getting there.  It hurts if I stand too much.  I'm still using the crutches.  It seems like the nerves are waking up now.  My pain level is a five or six.     DOS: 04/23/24 Procedure: Left bunionectomy with osteotomy. Left great toe exostectomy   48 y.o. female returns for POV#2. Relates doing well and managing pain.   Review of Systems: Negative except as noted in the HPI. Denies N/V/F/Ch.  Past Medical History:  Diagnosis Date   Anxiety    Depression    Epilepsy (HCC)    as a child   Heart murmur    Seizures (HCC)     Current Outpatient Medications:    amLODipine  (NORVASC ) 2.5 MG tablet, Take 1 tablet (2.5 mg total) by mouth daily., Disp: 90 tablet, Rfl: 3   busPIRone  (BUSPAR ) 7.5 MG tablet, Take 1 tablet (7.5 mg total) by mouth 2 (two) times daily., Disp: 60 tablet, Rfl: 2   ibuprofen  (ADVIL ) 800 MG tablet, Take 1 tablet (800 mg total) by mouth 3 (three) times daily., Disp: 60 tablet, Rfl: 1   oxyCODONE -acetaminophen  (PERCOCET) 5-325 MG tablet, Take 1 tablet by mouth every 6 (six) hours as needed for severe pain (pain score 7-10)., Disp: 28 tablet, Rfl: 0  Social History   Tobacco Use  Smoking Status Every Day   Current packs/day: 2.00   Average packs/day: 2.0 packs/day for 33.5 years (67.0 ttl pk-yrs)   Types: Cigarettes   Start date: 37  Smokeless Tobacco Never    No Known Allergies Objective:  There were no vitals filed for this visit. There is no height or weight on file to calculate BMI. Constitutional Well developed. Well nourished.  Vascular Foot warm and well perfused. Capillary refill normal to all digits.   Neurologic Normal speech. Oriented to person, place, and time. Epicritic sensation to light touch grossly  present bilaterally.  Dermatologic Skin healing well without signs of infection. Skin edges well coapted without signs of infection.  Orthopedic: Tenderness to palpation noted about the surgical site.   Radiographs: Hardware intact and toe well aligned.  Assessment:   1. Hallux valgus, left   2. Exostosis of toe    Plan:  Patient was evaluated and treated and all questions answered.  S/p foot surgery left -Progressing as expected post-operatively. -WB Status: WBAT in CAM boot  -Sutures: removed today without incident. -Medications: n/a -Foot redressed.  Return in 2 weeks with Dr. Janit.   Return in about 2 weeks (around 05/20/2024) for post op.

## 2024-05-20 ENCOUNTER — Encounter: Admitting: Podiatry

## 2024-05-27 ENCOUNTER — Encounter: Payer: Self-pay | Admitting: Podiatry

## 2024-05-27 ENCOUNTER — Ambulatory Visit (INDEPENDENT_AMBULATORY_CARE_PROVIDER_SITE_OTHER): Admitting: Podiatry

## 2024-05-27 ENCOUNTER — Ambulatory Visit (INDEPENDENT_AMBULATORY_CARE_PROVIDER_SITE_OTHER)

## 2024-05-27 VITALS — Ht 63.0 in | Wt 182.2 lb

## 2024-05-27 DIAGNOSIS — M2012 Hallux valgus (acquired), left foot: Secondary | ICD-10-CM

## 2024-05-27 MED ORDER — OXYCODONE-ACETAMINOPHEN 5-325 MG PO TABS: 1.0000 | ORAL_TABLET | Freq: Four times a day (QID) | ORAL | 0 refills | Status: DC | PRN

## 2024-05-27 NOTE — Progress Notes (Signed)
   Chief Complaint  Patient presents with   Routine Post Op    POV # 3 DOS 04/23/24 -LT BUNIONECTOMY W/ OSTEOTOMY, LT GREAT TOE EXOXTECTOMY    Subjective:  Patient presents today status post bunionectomy with osteotomy left foot as well as exostectomy to the IPJ of the left great toe.  DOS: 04/23/2024.  Doing well.  WBAT CAM boot as instructed.  No new complaints  Past Medical History:  Diagnosis Date   Anxiety    Depression    Epilepsy (HCC)    as a child   Heart murmur    Seizures (HCC)     Past Surgical History:  Procedure Laterality Date   FRACTURE SURGERY      No Known Allergies  Objective/Physical Exam Neurovascular status intact.  Incisions are nicely healed.  No appreciable edema.  Toe is in good rectus alignment.  There is some limited range of motion of the first MTP but it is still sensitive to touch and palpation so we did not do any aggressive range of motion exercises today  Radiographic Exam LT foot 05/27/2024:  Mostly unchanged.  Orthopedic hardware and osteotomies sites appear to be stable with routine healing.  Good alignment of the first ray.  Exostectomy noted to the IPJ  Assessment: 1. s/p bunionectomy with distal osteotomy LT.  Exostectomy IPJ LT. DOS: 04/23/2024   Plan of Care:  -Patient was evaluated. X-rays reviewed - Continue WBAT CAM boot for an additional 2 weeks -After 2 weeks the patient may transition out of the cam boot into good supportive tennis shoes and sneakers -Return to clinic 4 weeks follow-up x-ray  Thresa EMERSON Sar, DPM [facility]  Dr. Thresa EMERSON Sar, DPM    [address] [place], [place] [address]                Office [phone]  Fax [phone]

## 2024-06-17 ENCOUNTER — Ambulatory Visit (INDEPENDENT_AMBULATORY_CARE_PROVIDER_SITE_OTHER): Admitting: Otolaryngology

## 2024-06-17 ENCOUNTER — Encounter (INDEPENDENT_AMBULATORY_CARE_PROVIDER_SITE_OTHER): Payer: Self-pay | Admitting: Otolaryngology

## 2024-06-17 VITALS — BP 117/71 | HR 77

## 2024-06-17 DIAGNOSIS — F17210 Nicotine dependence, cigarettes, uncomplicated: Secondary | ICD-10-CM | POA: Diagnosis not present

## 2024-06-17 DIAGNOSIS — M542 Cervicalgia: Secondary | ICD-10-CM

## 2024-06-17 DIAGNOSIS — R490 Dysphonia: Secondary | ICD-10-CM | POA: Diagnosis not present

## 2024-06-17 DIAGNOSIS — Z720 Tobacco use: Secondary | ICD-10-CM

## 2024-06-17 NOTE — Progress Notes (Signed)
 ENT CONSULT:  Reason for Consult: chronic dysphonia hx of VF nodule   HPI: Discussed the use of AI scribe software for clinical note transcription with the patient, who gave verbal consent to proceed.  History of Present Illness Anita Ball is a 48 year old female who presents with voice changes and anterior neck pain. She was referred for evaluation of a possible vocal cord nodule.  She has been experiencing changes in her voice, with episodes where her voice changes or nothing comes out when she talks. A nodule was identified on an MRI approximately a year ago, and she recalls the initial discovery of a nodule about five years ago.  She describes anterior neck pain that began nearly two years ago, localized to the jugular vein area. The pain is throbbing and severe enough to require pain medication. An MRI was conducted due to this neck pain, initially diagnosed as a neck infection related to a tooth infection, which has since resolved. Her cardiologist suggested the pain might be muscle-related due to misalignment of her teeth. No history of trauma or chronic neck problems.  She smokes one to two packs of cigarettes a day and has attempted to quit twice in the past two years. She wants to quit smoking again.  She experiences eye pressure and pain on the left side, which she associates with mold in her house. However, her sinuses were clear on the MRI.     Records Reviewed:  Cards note 02/27/24 Anita Ball 48 year old woman does not have any significant cardiac history but does have risk factors in the form of smoking, elevated blood pressures today at the office visit without prior diagnosis of hypertension.  Relatively good baseline functional status but does report symptoms of chest pain under the left breast and shortness of breath with exertion.  Has chronic noncardiac and musculoskeletal sounding pain on the left anterior aspect of the neck.  And prior echocardiogram in  2015 noted increased gradients across the pulmonary valve with mean gradient of 14 mmHg.     Preoperative cardiovascular examination - Primary     Low risk noncardiac surgery on the left foot anticipated. Functional status difficult to assess but mentions she is able to walk without any major limitations but unable to assess if she is able to meet more than 4 METS activity.   Since the surgery is currently under general anesthesia as she reported, we will further stratify.   Will schedule her for treadmill EKG stress to assess her metabolic equivalents, if needing more than 4 METS, should be okay to proceed with surgery as being planned.     Past Medical History:  Diagnosis Date   Anxiety    Depression    Epilepsy (HCC)    as a child   Heart murmur    Seizures (HCC)     Past Surgical History:  Procedure Laterality Date   FRACTURE SURGERY      Family History  Problem Relation Age of Onset   Depression Mother    Diabetes Father    Cancer Father        prostate & melanoma   Alcoholism Father    Diabetes Maternal Grandfather     Social History:  reports that she has been smoking cigarettes. She started smoking about 33 years ago. She has a 67.3 pack-year smoking history. She has never used smokeless tobacco. She reports current alcohol use. She reports current drug use. Drug: Marijuana.  Allergies: No Known Allergies  Medications: I have reviewed the patient's current medications.  The PMH, PSH, Medications, Allergies, and SH were reviewed and updated.  ROS: Constitutional: Negative for fever, weight loss and weight gain. Cardiovascular: Negative for chest pain and dyspnea on exertion. Respiratory: Is not experiencing shortness of breath at rest. Gastrointestinal: Negative for nausea and vomiting. Neurological: Negative for headaches. Psychiatric: The patient is not nervous/anxious  Blood pressure 117/71, pulse 77, SpO2 96%. There is no height or weight on file to  calculate BMI.  PHYSICAL EXAM:  Exam: General: Well-developed, well-nourished Communication and Voice: Clear pitch and clarity Respiratory Respiratory effort: Equal inspiration and expiration without stridor Cardiovascular Peripheral Vascular: Warm extremities with equal color/perfusion Eyes: No nystagmus with equal extraocular motion bilaterally Neuro/Psych/Balance: Patient oriented to person, place, and time; Appropriate mood and affect; Gait is intact with no imbalance; Cranial nerves I-XII are intact Head and Face Inspection: Normocephalic and atraumatic without mass or lesion Palpation: Facial skeleton intact without bony stepoffs Salivary Glands: No mass or tenderness Facial Strength: Facial motility symmetric and full bilaterally ENT Pinna: External ear intact and fully developed External canal: Canal is patent with intact skin Tympanic Membrane: Clear and mobile External Nose: No scar or anatomic deformity Internal Nose: Septum is deviated to the left. No polyp, or purulence. Mucosal edema and erythema present.  Bilateral inferior turbinate hypertrophy.  Lips, Teeth, and gums: Mucosa and teeth intact and viable TMJ: No pain to palpation with full mobility Oral cavity/oropharynx: No erythema or exudate, no lesions present Nasopharynx: No mass or lesion with intact mucosa Hypopharynx: Intact mucosa without pooling of secretions Larynx Glottic: not examined due to patient's poor tolerance  Supraglottic: not examined due to patient's poor tolerance Neck Neck and Trachea: Midline trachea without mass or lesion Thyroid: No mass or nodularity Lymphatics: No lymphadenopathy  Procedure: Preoperative diagnosis: dysphonia   Postoperative diagnosis:   Same patient did not tolerate the exam  Procedure: Flexible fiberoptic laryngoscopy  Surgeon: Elena Larry, MD  Anesthesia: Topical lidocaine  and Afrin Complications: None Condition is stable throughout  exam  Indications and consent:  The patient presents to the clinic with above symptoms. Indirect laryngoscopy view was incomplete. Thus it was recommended that they undergo a flexible fiberoptic laryngoscopy. All of the risks, benefits, and potential complications were reviewed with the patient preoperatively and verbal informed consent was obtained.  Procedure: The patient was seated upright in the clinic. Topical lidocaine  and Afrin were applied to the nasal cavity. After adequate anesthesia had occurred, I then proceeded to pass the flexible telescope into the nasal cavity. The nasal cavity was patent without rhinorrhea or polyp. The nasopharynx was also patent without mass or lesion. At this point the patient started to cough and had gagging, and after multiple attempts we were not able to complete the procedure. The telescope was then slowly withdrawn and the patient tolerated the procedure throughout.   Studies Reviewed: MRI neck 09/21/23 FINDINGS: Pharynx and larynx: Negative; incidental small left nasopharyngeal retention cyst (series 7, image 3, normal variant). Normal parapharyngeal and retropharyngeal spaces.   Salivary glands: Negative.  Normal sublingual space.   Thyroid: Negative.   Lymph nodes: Small bilateral cervical lymph nodes are within normal limits (e.g. Series 7, image 13). No cervical lymphadenopathy.   Vascular: Preserved major vascular flow voids in the neck. The left vertebral artery appears slightly dominant, normal variant.   Limited intracranial: Cervicomedullary junction is within normal limits. Negative posterior fossa. Grossly negative other visible brain parenchyma. No dural thickening or abnormal enhancement  identified.   Visualized orbits: Negative.   Mastoids and visualized paranasal sinuses: Visualized paranasal sinuses are well aerated. Trace mastoid air cell fluid more so on the right. Grossly normal other visible internal  auditory structures.   Skeleton: Visualized bone marrow signal is within normal limits. Normal for age visible cervical spine. Negative visible spinal cord. No abnormal intradural enhancement. No dural thickening.   Upper chest: Negative.   Other: No superficial soft tissue edema or inflammation. No neck mass identified.   IMPRESSION: Normal MRI appearance of the Neck.    Assessment/Plan: Encounter Diagnoses  Name Primary?   Neck pain Yes   Cervicalgia    Hoarseness    Tobacco abuse    Dysphonia     Assessment and Plan Assessment & Plan Anterior neck pain Chronic anterior neck pain for nearly two years, likely musculoskeletal in origin. MRI neck w/con negative for significant findings Differential includes cervical spine issues or muscle tension from dental occlusion changes.  - Refer to primary care provider for further evaluation and potential referral to Spine/Ortho - Consult spine specialist for cervical spine assessment. - Suggest massage therapy for muscular relief.  Dysphonia hx of tobacco use She reports hoarseness and occasional voice use. Unfortunately we did not finish flexible scope exam today due to patient's poor tolerance of the procedure. She was under the impression there was a vocal cord nodule on MRI neck, but per report there is a benign nasopharyngeal cyst noted.  - return if she desires to complete procedure   Tobacco use disorder.  We had an extensive discussion about detrimental effects of smoking on overall health. I provided resources available at [facility] to assist with smoking cessation. I spent 4 min on counseling  - Advised smoking cessation   Thank you for allowing me to participate in the care of this patient. Please do not hesitate to contact me with any questions or concerns.   Elena Larry, MD Otolaryngology [facility] Phone: [phone] Fax: [phone]    06/17/2024, 3:36 PM

## 2024-06-17 NOTE — Patient Instructions (Signed)
 Dear Comer CROME Rubey,   Congratulations for your interest in quitting smoking!  Find a program that suits you best: when you want to quit, how you need support, where you live, and how you like to learn.    If you're ready to get started TODAY, consider scheduling a visit through [email or web address].  Appointments are available from 8am to 8pm, Monday to Friday.   Most health insurance plans will cover some level of tobacco cessation visits and medications.    Additional Resources: [employer] are also available to help you quit & provide the support you'll need. Many programs are available in both [place] and Spanish and have a long history of successfully helping people get off and stay off tobacco.    Quit Smoking Apps:  quitSTART at [employer].de QuitGuide?at ForgetParking.dk Online education and resources: Smokefree  at [employer].gov Free Telephone Coaching: QuitNow,  Call 1-800-QUIT-NOW ((667)160-8310) or Text- Ready to (502)617-5782 *[employer] has teamed up with Medicaid to offer a free 14 week program    Vaping- Want to Quit? Free 24/7 support. Call [facility]  [place], [place], [place], [place], [place]  [facility]

## 2024-06-24 ENCOUNTER — Encounter: Payer: Self-pay | Admitting: Podiatry

## 2024-06-24 ENCOUNTER — Ambulatory Visit (INDEPENDENT_AMBULATORY_CARE_PROVIDER_SITE_OTHER)

## 2024-06-24 ENCOUNTER — Ambulatory Visit (INDEPENDENT_AMBULATORY_CARE_PROVIDER_SITE_OTHER): Admitting: Podiatry

## 2024-06-24 VITALS — Ht 63.0 in | Wt 182.2 lb

## 2024-06-24 DIAGNOSIS — M2012 Hallux valgus (acquired), left foot: Secondary | ICD-10-CM | POA: Diagnosis not present

## 2024-06-24 NOTE — Progress Notes (Signed)
   Chief Complaint  Patient presents with   Routine Post Op    POV # 4 DOS 04/23/24 -LT BUNIONECTOMY W/ OSTEOTOMY, LT GREAT TOE EXOXTECTOMY pt is here to f/u on left foot she states still some pain to the foot, states she is on it a lot, hurts mostly at night, when settle she ices foot and elevates it.    Subjective:  Patient presents today status post bunionectomy with osteotomy left foot as well as exostectomy to the IPJ of the left great toe.  DOS: 04/23/2024.  Continues to do well. She is currently wearing sneakers  Past Medical History:  Diagnosis Date   Anxiety    Depression    Epilepsy (HCC)    as a child   Heart murmur    Seizures (HCC)     Past Surgical History:  Procedure Laterality Date   FRACTURE SURGERY      No Known Allergies  Objective/Physical Exam Neurovascular status intact.  Incisions are nicely healed.  No appreciable edema.  Toe is in good rectus alignment.  Limited range of motion of the first MTP.  Radiographic Exam LT foot 06/24/2024:  Mostly unchanged.  Orthopedic hardware and osteotomies sites appear to be stable with routine healing.  Good alignment of the first ray.  Exostectomy noted to the IPJ.  Moderate degenerative changes noted to the first MTP  Assessment: 1. s/p bunionectomy with distal osteotomy LT.  Exostectomy IPJ LT. DOS: 04/23/2024   Plan of Care:  -Patient was evaluated. X-rays reviewed - Continue activity and weightbearing in supportive sneakers.  Advised against going barefoot -May slowly increase activity as tolerated -Return to clinic PRN  *Moving to [place]  in 1 month    Thresa EMERSON Sar, DPM [facility]  Dr. Thresa EMERSON Sar, DPM    [address] [place], [place] [address]                Office [phone]  Fax [phone]

## 2024-06-30 ENCOUNTER — Ambulatory Visit: Payer: Self-pay | Admitting: *Deleted

## 2024-06-30 NOTE — Telephone Encounter (Signed)
 FYI Only or Action Required?: Action required by provider: request for appointment and update on patient condition.  Patient was last seen in primary care on 04/06/2024 by Nedra Tinnie LABOR, NP.  Called Nurse Triage reporting Nausea.  Symptoms began several days ago.  Interventions attempted: Rest, hydration, or home remedies.  Symptoms are: gradually worsening.  Triage Disposition: See HCP Within 4 Hours (Or PCP Triage) 4- 24 hours  Patient/caregiver understands and will follow disposition?: Yes              Copied from CRM #[id number]. Topic: Clinical - Red Word Triage >> Jun 30, 2024  2:50 PM Mercedes MATSU wrote: Red Word that prompted transfer to Nurse Triage: Patient called in stating that she was cleaning her attic and afterwards she became very sick. She is concerned she inhaled either mold or rodent droppings. Her symptoms include: vomiting, slight difficulty breathing, fever, chills, nausea, she took a Covid and flu test both were negative. Patient is extremely concerned. Reason for Disposition  MILD difficulty breathing (e.g., minimal/no SOB at rest, SOB with walking, pulse <100)  Answer Assessment - Initial Assessment Questions Appt scheduled tomorrow with PCP. Patient reports she has been exposed to her mother that was positive for covid and now over it'. Patient sx started Thursday and her at home covid test negative. Patient concerned bc she cleaned out attic and noted mold and rat droppings' and feels she may have inhaled dust. Recommended if sx worsen go to ED. Patient denies chest pain no difficulty breathing no fever now. Recommended warm liquids , hydration, eating bland foods, rest.       1. COVID-19 DIAGNOSIS: How do you know that you have COVID? (e.g., positive lab test or self-test, diagnosed by doctor or NP/PA, symptoms after exposure).     At home tests for covid / flu negative. Patient has been exposed to mother that was positive for covid and just got  over it 2. COVID-19 EXPOSURE: Was there any known exposure to COVID before the symptoms began? CDC Definition of close contact: within 6 feet (2 meters) for a total of 15 minutes or more over a 24-hour period.      Yes mother 3. ONSET: When did the COVID-19 symptoms start?      Thursday  4. WORST SYMPTOM: What is your worst symptom? (e.g., cough, fever, shortness of breath, muscle aches)     SOB with exertion, nausea, diarrhea.  5. COUGH: Do you have a cough? If Yes, ask: How bad is the cough?       Dry cough 6. FEVER: Do you have a fever? If Yes, ask: What is your temperature, how was it measured, and when did it start?     na 7. RESPIRATORY STATUS: Describe your breathing? (e.g., normal; shortness of breath, wheezing, unable to speak)      SOB with exertion 8. BETTER-SAME-WORSE: Are you getting better, staying the same or getting worse compared to yesterday?  If getting worse, ask, In what way?     Not better  9. OTHER SYMPTOMS: Do you have any other symptoms?  (e.g., chills, fatigue, headache, loss of smell or taste, muscle pain, sore throat)     Nausea , diarrhea on and off, abdominal cramping, sore throat , nasal congestion, SOB with exertion . 10. HIGH RISK DISEASE: Do you have any chronic medical problems? (e.g., asthma, heart or lung disease, weak immune system, obesity, etc.)       Yes  11. VACCINE: Have you  had the COVID-19 vaccine? If Yes, ask: Which one, how many shots, when did you get it?       Na  12. PREGNANCY: Is there any chance you are pregnant? When was your last menstrual period?       na 13. O2 SATURATION MONITOR:  Do you use an oxygen saturation monitor (pulse oximeter) at home? If Yes, ask What is your reading (oxygen level) today? What is your usual oxygen saturation reading? (e.g., 95%)       na  Protocols used: Coronavirus (COVID-19) Diagnosed or Suspected-A-AH

## 2024-06-30 NOTE — Telephone Encounter (Signed)
 Noted. Patient scheduled by nurse triage for an appointment with Lauren for tomorrow at 9am.

## 2024-07-01 ENCOUNTER — Encounter: Payer: Self-pay | Admitting: Nurse Practitioner

## 2024-07-01 ENCOUNTER — Ambulatory Visit: Admitting: Nurse Practitioner

## 2024-07-01 VITALS — BP 164/90 | HR 68 | Temp 98.5°F | Wt 175.0 lb

## 2024-07-01 DIAGNOSIS — Z720 Tobacco use: Secondary | ICD-10-CM | POA: Diagnosis not present

## 2024-07-01 DIAGNOSIS — J22 Unspecified acute lower respiratory infection: Secondary | ICD-10-CM | POA: Diagnosis not present

## 2024-07-01 DIAGNOSIS — I10 Essential (primary) hypertension: Secondary | ICD-10-CM | POA: Insufficient documentation

## 2024-07-01 LAB — POC COVID19 BINAXNOW: SARS Coronavirus 2 Ag: NEGATIVE

## 2024-07-01 MED ORDER — AZITHROMYCIN 250 MG PO TABS: ORAL_TABLET | ORAL | 0 refills | Status: AC

## 2024-07-01 MED ORDER — ONDANSETRON HCL 4 MG PO TABS: 4.0000 mg | ORAL_TABLET | Freq: Three times a day (TID) | ORAL | 0 refills | Status: DC | PRN

## 2024-07-01 NOTE — Assessment & Plan Note (Signed)
 Despite negative COVID-19 tests, her smoking history and persistent symptoms warrant antibiotic treatment. Prescribe Azithromax: two tablets today, then one daily for four days. Prescribe Zofran  4mg  for nausea every eight hours as needed. Advise plenty of fluids and rest.

## 2024-07-01 NOTE — Progress Notes (Signed)
 Acute Office Visit  Subjective:     Patient ID: Anita Ball, female    DOB: [birth year], 48 y.o.   MRN: [id number]  Chief Complaint  Patient presents with   Nausea    Low grade temp, diarrhea, SOB with exertion, since Thursday, covid exposure     HPI Discussed the use of AI scribe software for clinical note transcription with the patient, who gave verbal consent to proceed.  History of Present Illness   Anita Ball is a 48 year old female who presents with nausea, fever, and diarrhea.  She has experienced nausea, intermittent fever, and diarrhea since Thursday. She also has a sore throat and a sensation in her chest, described as a noticeable presence rather than pain. Her sinuses are mostly dry but occasionally runny, with sneezing. Two COVID tests were negative, but her mother recently tested positive, raising concerns about potential exposure.  She is anxious about possible exposure to rat poison while cleaning her garage and attic, where she inhaled dust. Mold in her house has also contributed to her anxiety. She experiences occasional shortness of breath, not severe enough to feel like gasping, and denies wheezing. She has clear phlegm, sometimes yellow, attributed to smoking. She almost vomited yesterday morning but managed to hold it in.  She is not taking specific medication for her symptoms except for ibuprofen  as needed for fever. She lacks a working thermometer but feels she has a low-grade fever due to burning eyes and feeling feverish. She has not taken her blood pressure medication in a while and takes anxiety medication as needed but not regularly. 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      ROS See pertinent positives and negatives per HPI.     Objective:    BP (!) 164/90 (BP Location: Left Arm, Cuff Size: Normal)   Pulse 68   Temp 98.5 F (36.9 C) (Oral)   Wt 175 lb (79.4 kg) Comment: with  walking boot  SpO2 98%   BMI 31.00 kg/m    Physical Exam Vitals and nursing note reviewed.  Constitutional:      General: She is not in acute distress.    Appearance: Normal appearance.  HENT:     Head: Normocephalic.     Right Ear: Tympanic membrane, ear canal and external ear normal.     Left Ear: Tympanic membrane, ear canal and external ear normal.     Mouth/Throat:     Mouth: Mucous membranes are moist.     Pharynx: Posterior oropharyngeal erythema present. No oropharyngeal exudate.  Eyes:     Conjunctiva/sclera: Conjunctivae normal.  Cardiovascular:     Rate and Rhythm: Normal rate and regular rhythm.     Pulses: Normal pulses.     Heart sounds: Normal heart sounds.  Pulmonary:     Effort: Pulmonary effort is normal.     Breath sounds: Normal breath sounds.  Musculoskeletal:     Cervical back: Normal range of motion and neck supple. No tenderness.  Lymphadenopathy:     Cervical: No cervical adenopathy.  Skin:    General: Skin is warm.  Neurological:     General: No focal deficit present.     Mental Status: She is alert and oriented to person, place, and time.  Psychiatric:        Mood and Affect: Mood normal.        Behavior: Behavior normal.  Thought Content: Thought content normal.        Judgment: Judgment normal.     Results for orders placed or performed in visit on 07/01/24  POC COVID-19 BinaxNow  Result Value Ref Range   SARS Coronavirus 2 Ag Negative Negative        Assessment & Plan:   Problem List Items Addressed This Visit       Cardiovascular and Mediastinum   Primary hypertension   Chronic, not controlled. Blood pressure is elevated at 164/90 due to non-compliance with medication and lack of home monitoring. Advise resuming amlodipine  2.5mg  daily. Recommend purchasing a home blood pressure monitor for regular checks. Follow-up in 3 weeks.         Respiratory   Lower respiratory tract infection - Primary   Despite negative  COVID-19 tests, her smoking history and persistent symptoms warrant antibiotic treatment. Prescribe Azithromax: two tablets today, then one daily for four days. Prescribe Zofran  4mg  for nausea every eight hours as needed. Advise plenty of fluids and rest.      Relevant Medications   azithromycin  (ZITHROMAX ) 250 MG tablet   Other Relevant Orders   POC COVID-19 BinaxNow (Completed)     Other   Tobacco use   She is a chronic smoker with phlegm production and acknowledges that smoking exacerbates her symptoms. Recommend complete tobacco cessation.       Meds ordered this encounter  Medications   azithromycin  (ZITHROMAX ) 250 MG tablet    Sig: Take 2 tablets on day 1, then 1 tablet daily on days 2 through 5    Dispense:  6 tablet    Refill:  0   ondansetron  (ZOFRAN ) 4 MG tablet    Sig: Take 1 tablet (4 mg total) by mouth every 8 (eight) hours as needed.    Dispense:  30 tablet    Refill:  0    Return in about 3 weeks (around 07/22/2024) for HTN.  Tinnie DELENA Harada, NP

## 2024-07-01 NOTE — Assessment & Plan Note (Signed)
 Chronic, not controlled. Blood pressure is elevated at 164/90 due to non-compliance with medication and lack of home monitoring. Advise resuming amlodipine  2.5mg  daily. Recommend purchasing a home blood pressure monitor for regular checks. Follow-up in 3 weeks.

## 2024-07-01 NOTE — Assessment & Plan Note (Signed)
 She is a chronic smoker with phlegm production and acknowledges that smoking exacerbates her symptoms. Recommend complete tobacco cessation.

## 2024-07-01 NOTE — Patient Instructions (Signed)
 It was great to see you!  Re-start taking your blood pressure medication  Start zithromax  2 tablets today, then 1 tablet daily until gone  Take zofran  3 times a day as needed for nausea   Drink plenty of fluids and get rest.  Let's follow-up before you move, sooner if you have concerns.  If a referral was placed today, you will be contacted for an appointment. Please note that routine referrals can sometimes take up to 3-4 weeks to process. Please call our office if you haven't heard anything after this time frame.  Take care,  Tinnie Harada, NP

## 2024-07-15 ENCOUNTER — Telehealth: Payer: Self-pay | Admitting: Nurse Practitioner

## 2024-07-15 NOTE — Telephone Encounter (Signed)
 I tried to call patient and no answer and mailbox was full.      Lauren will be back in the office on Monday and can address message.

## 2024-07-15 NOTE — Telephone Encounter (Signed)
 Copied from CRM [id number]. Topic: Medical Record Request - Other >> Jul 15, 2024 11:31 AM Suzen RAMAN wrote: Reason for CRM: Patient is requesting a ESA letter for her apartment preferable Monday 07/20/24 at the latest. Please contact patient once letter is available.   RA#[phone]

## 2024-07-20 ENCOUNTER — Ambulatory Visit

## 2024-07-20 VITALS — BP 132/84 | HR 62 | Ht 62.6 in | Wt 174.1 lb

## 2024-07-20 DIAGNOSIS — I370 Nonrheumatic pulmonary valve stenosis: Secondary | ICD-10-CM | POA: Diagnosis not present

## 2024-07-20 DIAGNOSIS — I10 Essential (primary) hypertension: Secondary | ICD-10-CM | POA: Diagnosis not present

## 2024-07-20 DIAGNOSIS — I351 Nonrheumatic aortic (valve) insufficiency: Secondary | ICD-10-CM | POA: Diagnosis not present

## 2024-07-20 NOTE — Progress Notes (Signed)
 Cardiology Consultation:    Date:  07/20/2024   ID:  Anita Ball, DOB Jun 07, 1976, MRN [id number]  PCP:  Nedra Tinnie LABOR, NP  Cardiologist:  Alean JONELLE Kobus, MD   Referring MD: Nedra Tinnie LABOR, NP   No chief complaint on file.    ASSESSMENT AND PLAN:   Anita Ball 48 year old woman history of pulmonary valve stenosis reported on prior echocardiogram 2015 with mean gradient of 14 mmHg, smokes tobacco, occasional alcohol consumption, occasional marijuana smoking, treadmill EKG stress test 04/03/2024 noted good exercise capacity [10.1 METS, 91% MPHR, no ischemia], repeat echocardiogram 03/27/2024 noted LVEF 55 to 60%,Trace MR, mild aortic insufficiency, no significant issues with pulmonary valve well-visualized.  Overall doing well. Here for routine follow-up visit.  Problem List Items Addressed This Visit     Pulmonary valve stenosis   No evidence of pulmonary valve stenosis on recent repeat echocardiogram May 2025.       Mild aortic regurgitation, by echocardiogram May 2025 - Primary   Reviewed results. Repeat follow-up echocardiogram tentatively in 18 to 24 months.       Primary hypertension   Elevated blood pressures. Target below 130/80 mmHg. Advised to continue amlodipine  2.5 mg once daily and be consistent with adherence to the medications. If uncontrolled, amlodipine  dose can be further titrated up to 5 mg once daily. Advised her to monitor blood pressures at home consistently.        Return to clinic tentatively in 1 year.  History of Present Illness:    Anita Ball is Ball 48 y.o. female who is being seen today for follow-up visit. PCP is McElwee, [name], NP. Last visit with me in the office was 02/27/2024.  Works as Ball Teacher, adult education.  Lives at home by herself.  Mentions she is planning on moving to [place]  next week.  Will keep visiting the area for her family.  Has history of pulmonary valve stenosis reported on prior echocardiogram  2015 with mean gradient of 14 mmHg, smokes tobacco, occasional alcohol consumption, occasional marijuana smoking, treadmill EKG stress test 04/03/2024 noted good exercise capacity [10.1 METS, 91% MPHR, no ischemia], repeat echocardiogram 03/27/2024 noted LVEF 55 to 60%,Trace MR, mild aortic insufficiency, no significant issues with pulmonary valve well-visualized.  Mentions overall she has been doing well. Blood pressures at times have been elevated while she was monitoring using her dad's device.  Has been started on amlodipine  2.5 mg once daily. Mentions she forgets to take medications at least couple times Ball week.  Denies any chest pain or shortness of breath. Occasionally reports headaches. No syncopal or near syncopal episodes. No palpitations.  Continues to smoke cigarettes is on working on quitting. Does not vape. Occasional alcohol and marijuana use.  Currently ambulating with the boot still on after foot surgery.  Past Medical History:  Diagnosis Date   Anxiety    Depression    Epilepsy (HCC)    as Ball child   Heart murmur    Seizures (HCC)     Past Surgical History:  Procedure Laterality Date   FRACTURE SURGERY      Current Medications: Current Meds  Medication Sig   amLODipine  (NORVASC ) 2.5 MG tablet Take 1 tablet (2.5 mg total) by mouth daily.   busPIRone  (BUSPAR ) 7.5 MG tablet Take 1 tablet (7.5 mg total) by mouth 2 (two) times daily. (Patient taking differently: Take 7.5 mg by mouth as needed (anxiety).)   ibuprofen  (ADVIL ) 800 MG tablet Take 1 tablet (800 mg total)  by mouth 3 (three) times daily. (Patient taking differently: Take 800 mg by mouth as needed for mild pain (pain score 1-3) or moderate pain (pain score 4-6).)   [DISCONTINUED] ondansetron  (ZOFRAN ) 4 MG tablet Take 1 tablet (4 mg total) by mouth every 8 (eight) hours as needed.     Allergies:   Patient has no known allergies.   Social History   Socioeconomic History   Marital status: Widowed     Spouse name: Not on file   Number of children: Not on file   Years of education: Not on file   Highest education level: Not on file  Occupational History   Not on file  Tobacco Use   Smoking status: Every Day    Current packs/day: 2.00    Average packs/day: 2.0 packs/day for 33.7 years (67.4 ttl pk-yrs)    Types: Cigarettes    Start date: 69   Smokeless tobacco: Never  Vaping Use   Vaping status: Never Used  Substance and Sexual Activity   Alcohol use: Yes    Comment: occasionally   Drug use: Yes    Types: Marijuana   Sexual activity: Yes  Other Topics Concern   Not on file  Social History Narrative   Not on file   Social Drivers of Health   Financial Resource Strain: Not on file  Food Insecurity: Not on file  Transportation Needs: Not on file  Physical Activity: Not on file  Stress: Not on file  Social Connections: Not on file     Family History: The patient's family history includes Alcoholism in her father; Cancer in her father; Depression in her mother; Diabetes in her father and maternal grandfather. ROS:   Please see the history of present illness.    All 14 point review of systems negative except as described per history of present illness.  EKGs/Labs/Other Studies Reviewed:    The following studies were reviewed today:   EKG:       Recent Labs: 07/29/2023: TSH 0.73 04/06/2024: ALT 22; BUN 11; Creat 0.86; Hemoglobin 14.7; Platelets 237; Potassium 4.1; Sodium 141  Recent Lipid Panel    Component Value Date/Time   CHOL 194 04/06/2024 1523   TRIG 217 (H) 04/06/2024 1523   HDL 53 04/06/2024 1523   CHOLHDL 3.7 04/06/2024 1523   VLDL 34.6 07/29/2023 1036   LDLCALC 107 (H) 04/06/2024 1523    Physical Exam:    VS:  BP 132/84   Pulse 62   Ht 5' 2.6 (1.59 m)   Wt 174 lb 1.3 oz (79 kg)   SpO2 97%   BMI 31.23 kg/m     Wt Readings from Last 3 Encounters:  07/20/24 174 lb 1.3 oz (79 kg)  07/01/24 175 lb (79.4 kg)  06/24/24 182 lb 3.2 oz (82.6 kg)      GENERAL:  Well nourished, well developed in no acute distress NECK: No JVD; No carotid bruits CARDIAC: RRR, S1 and S2 present, no murmurs, no rubs, no gallops CHEST:  Clear to auscultation without rales, wheezing or rhonchi  Extremities: No pitting pedal edema. Pulses bilaterally symmetric with radial 2+ and dorsalis pedis 2+ NEUROLOGIC:  Alert and oriented x 3  Medication Adjustments/Labs and Tests Ordered: Current medicines are reviewed at length with the patient today.  Concerns regarding medicines are outlined above.  No orders of the defined types were placed in this encounter.  No orders of the defined types were placed in this encounter.   Signed, Lillyan Hitson reddy Maron Stanzione,  MD, MPH, [facility]. 07/20/2024 11:36 AM    [facility]

## 2024-07-20 NOTE — Assessment & Plan Note (Addendum)
 Elevated blood pressures. Target below 130/80 mmHg. Advised to continue amlodipine  2.5 mg once daily and be consistent with adherence to the medications. If uncontrolled, amlodipine  dose can be further titrated up to 5 mg once daily. Advised her to monitor blood pressures at home consistently.

## 2024-07-20 NOTE — Assessment & Plan Note (Signed)
 Reviewed results. Repeat follow-up echocardiogram tentatively in 18 to 24 months.

## 2024-07-20 NOTE — Assessment & Plan Note (Signed)
 No evidence of pulmonary valve stenosis on recent repeat echocardiogram May 2025.

## 2024-07-20 NOTE — Patient Instructions (Signed)
 Medication Instructions:  Your physician recommends that you continue on your current medications as directed. Please refer to the Current Medication list given to you today.  *If you need a refill on your cardiac medications before your next appointment, please call your pharmacy*  Lab Work: NONE If you have labs (blood work) drawn today and your tests are completely normal, you will receive your results only by: MyChart Message (if you have MyChart) OR A paper copy in the mail If you have any lab test that is abnormal or we need to change your treatment, we will call you to review the results.  Testing/Procedures: NONE  Follow-Up: At [facility], you and your health needs are our priority.  As part of our continuing mission to provide you with exceptional heart care, our providers are all part of one team.  This team includes your primary Cardiologist (physician) and Advanced Practice Providers or APPs (Physician Assistants and Nurse Practitioners) who all work together to provide you with the care you need, when you need it.  Your next appointment:   1 year(s)  Provider:  Dr. Liborio

## 2024-07-20 NOTE — Telephone Encounter (Signed)
 Patient return call inquiry about ESA letter. Patient advised per chart notations. Patient would like to be notified once document is complete.  RA#[phone]

## 2024-07-20 NOTE — Telephone Encounter (Unsigned)
 Copied from CRM #[id number]. Topic: General - Other >> Jul 20, 2024  3:43 PM Dedra B wrote: Reason for CRM: Pt called to follow up on ESA letter. Pt needs the letter to get an apt. Pls notify pt when the letter is done.

## 2024-07-21 ENCOUNTER — Encounter: Payer: Self-pay | Admitting: Nurse Practitioner

## 2024-07-21 NOTE — Telephone Encounter (Signed)
 I called and spoke with patient and she said that she has a lab mixed dog.

## 2024-07-22 ENCOUNTER — Encounter: Payer: Self-pay | Admitting: Nurse Practitioner

## 2024-07-22 ENCOUNTER — Ambulatory Visit (INDEPENDENT_AMBULATORY_CARE_PROVIDER_SITE_OTHER): Admitting: Nurse Practitioner

## 2024-07-22 VITALS — BP 122/84 | HR 74 | Temp 97.4°F | Ht 62.6 in | Wt 175.0 lb

## 2024-07-22 DIAGNOSIS — I10 Essential (primary) hypertension: Secondary | ICD-10-CM | POA: Diagnosis not present

## 2024-07-22 DIAGNOSIS — F419 Anxiety disorder, unspecified: Secondary | ICD-10-CM

## 2024-07-22 NOTE — Progress Notes (Signed)
 Established Patient Office Visit  Subjective   Patient ID: Anita Ball, female    DOB: [birth year]  Age: 48 y.o. MRN: [id number]  Chief Complaint  Patient presents with   Hypertension    Follow up   HPI: Discussed the use of AI scribe software for clinical note transcription with the patient, who gave verbal consent to proceed.  History of Present Illness   Anita Ball is a 48 year old female with hypertension and generalized anxiety disorder who presents with recent panic attacks and stress related to housing issues.  She experiences panic attacks over the last two days, with episodes of dyspnea often triggered by breath-holding. She has generalized anxiety disorder but has not had a panic attack in a long time. She takes Buspar  for anxiety as needed. She also takes amlodipine  daily for hypertension but lacks a home blood pressure monitor. She denies chest pain or leg swelling. She experiences shortness of breath, attributed to anxiety and panic attacks. Significant stress arises from a delay in her housing closing, with limited time to find new accommodation. She has options to stay with her parents or a friend in [place]  but is concerned about the stress of moving and isolation. Challenges in securing a rental are due to lack of current employment, despite having job opportunities lined up.       ROS See pertinent positives and negatives per HPI.    Objective:     BP 122/84 (BP Location: Right Arm, Patient Position: Sitting, Cuff Size: Normal)   Pulse 74   Temp (!) 97.4 F (36.3 C)   Ht 5' 2.6 (1.59 m)   Wt 175 lb (79.4 kg)   SpO2 98%   BMI 31.40 kg/m    Physical Exam Vitals and nursing note reviewed.  Constitutional:      General: She is not in acute distress.    Appearance: Normal appearance.  HENT:     Head: Normocephalic.  Eyes:     Conjunctiva/sclera: Conjunctivae normal.  Cardiovascular:     Rate and Rhythm: Normal rate and regular  rhythm.     Pulses: Normal pulses.     Heart sounds: Normal heart sounds.  Pulmonary:     Effort: Pulmonary effort is normal.     Breath sounds: Normal breath sounds.  Musculoskeletal:     Cervical back: Normal range of motion.  Skin:    General: Skin is warm.  Neurological:     General: No focal deficit present.     Mental Status: She is alert and oriented to person, place, and time.  Psychiatric:        Mood and Affect: Mood normal. Affect is tearful.        Behavior: Behavior normal.        Thought Content: Thought content normal.        Judgment: Judgment normal.    The 10-year ASCVD risk score (Arnett DK, et al., 2019) is: 3.7%    Assessment & Plan:   Problem List Items Addressed This Visit       Cardiovascular and Mediastinum   Primary hypertension - Primary   Chronic, stable. Hypertension is well-controlled with amlodipine  2.5mg  daily, and blood pressure is within normal range. Continue amlodipine  once daily. Encourage obtaining a home blood pressure monitor for regular monitoring.         Other   Anxiety   Chronic, ongoing. Panic attacks have worsened due to stressors such as housing instability and job uncertainty.  Shortness of breath during attacks is stress-related, not cardiac. Continue Buspar  7.5mg  BID prn. Information printed on non-pharmacological ways to help anxiety.       Return in about 6 months (around 01/19/2025) for CPE.    Tinnie DELENA Harada, NP

## 2024-07-22 NOTE — Telephone Encounter (Signed)
 Patient notified and will pick up prior to appointment.

## 2024-07-22 NOTE — Assessment & Plan Note (Signed)
 Chronic, ongoing. Panic attacks have worsened due to stressors such as housing instability and job uncertainty. Shortness of breath during attacks is stress-related, not cardiac. Continue Buspar  7.5mg  BID prn. Information printed on non-pharmacological ways to help anxiety.

## 2024-07-22 NOTE — Assessment & Plan Note (Signed)
 Chronic, stable. Hypertension is well-controlled with amlodipine  2.5mg  daily, and blood pressure is within normal range. Continue amlodipine  once daily. Encourage obtaining a home blood pressure monitor for regular monitoring.

## 2024-07-22 NOTE — Patient Instructions (Signed)
 It was great to see you!  Keep taking the blood pressure medication daily   Let's follow-up in 6 months, sooner if you have concerns.  If a referral was placed today, you will be contacted for an appointment. Please note that routine referrals can sometimes take up to 3-4 weeks to process. Please call our office if you haven't heard anything after this time frame.  Take care,  Tinnie Harada, NP

## 2024-11-03 ENCOUNTER — Telehealth: Payer: Self-pay

## 2024-11-03 ENCOUNTER — Telehealth: Payer: Self-pay | Admitting: Emergency Medicine

## 2024-11-03 MED ORDER — AMLODIPINE BESYLATE 2.5 MG PO TABS: 2.5000 mg | ORAL_TABLET | Freq: Every day | ORAL | 3 refills | Status: AC

## 2024-11-03 NOTE — Telephone Encounter (Signed)
 Refills has been sent to the pharmacy.

## 2024-11-03 NOTE — Telephone Encounter (Signed)
 STAT if patient feels like he/she is going to faint   Are you dizzy, lightheaded, or faint now?  No   Have you passed out?  No  Do you have any other symptoms?  Puffiness in neck face patient assumes due to inflammation.  Have you checked your HR and BP (record if available)?  Hasn't been able to check. Doesn't have cuff.

## 2024-11-03 NOTE — Telephone Encounter (Signed)
" °*  STAT* If patient is at the pharmacy, call can be transferred to refill team.   1. Which medications need to be refilled? (please list name of each medication and dose if known) amLODipine  (NORVASC ) 2.5 MG tablet  2. Which pharmacy/location (including street and city if local pharmacy) is medication to be sent to? [facility] - [address], [place], [place] [address]  3. Do they need a 30 day or 90 day supply?  90 day supply + refills if possible  "

## 2024-11-03 NOTE — Telephone Encounter (Signed)
 See telephone notes encounter.
